# Patient Record
Sex: Female | Born: 1975 | Race: White | Hispanic: Yes | Marital: Married | Smoking: Never smoker
Health system: Southern US, Community
[De-identification: ages and names within clinical notes are randomized; demographics above are authoritative.]

## PROBLEM LIST (undated history)

## (undated) DIAGNOSIS — T8859XA Other complications of anesthesia, initial encounter: Secondary | ICD-10-CM

## (undated) DIAGNOSIS — T4145XA Adverse effect of unspecified anesthetic, initial encounter: Secondary | ICD-10-CM

## (undated) DIAGNOSIS — Z9889 Other specified postprocedural states: Secondary | ICD-10-CM

## (undated) DIAGNOSIS — Z1509 Genetic susceptibility to other malignant neoplasm: Principal | ICD-10-CM

## (undated) DIAGNOSIS — E7800 Pure hypercholesterolemia, unspecified: Secondary | ICD-10-CM

## (undated) DIAGNOSIS — R112 Nausea with vomiting, unspecified: Secondary | ICD-10-CM

## (undated) DIAGNOSIS — Z1501 Genetic susceptibility to malignant neoplasm of breast: Secondary | ICD-10-CM

## (undated) HISTORY — DX: Genetic susceptibility to malignant neoplasm of breast: Z15.01

## (undated) HISTORY — PX: MASTECTOMY: SHX3

## (undated) HISTORY — DX: Genetic susceptibility to malignant neoplasm of breast: Z15.09

## (undated) HISTORY — DX: Pure hypercholesterolemia, unspecified: E78.00

## (undated) HISTORY — PX: WISDOM TOOTH EXTRACTION: SHX21

---

## 2009-03-23 ENCOUNTER — Emergency Department (HOSPITAL_COMMUNITY): Admission: EM | Admit: 2009-03-23 | Discharge: 2009-03-23 | Payer: Self-pay | Admitting: Emergency Medicine

## 2009-05-11 ENCOUNTER — Ambulatory Visit: Payer: Self-pay

## 2010-01-04 ENCOUNTER — Encounter: Admission: RE | Admit: 2010-01-04 | Discharge: 2010-01-04 | Payer: Self-pay | Admitting: Obstetrics and Gynecology

## 2010-01-18 ENCOUNTER — Ambulatory Visit: Payer: Self-pay | Admitting: Internal Medicine

## 2010-02-02 ENCOUNTER — Encounter: Admission: RE | Admit: 2010-02-02 | Discharge: 2010-02-02 | Payer: Self-pay | Admitting: Obstetrics and Gynecology

## 2010-07-01 LAB — POCT I-STAT, CHEM 8
Calcium, Ion: 1.14 mmol/L (ref 1.12–1.32)
Chloride: 103 mEq/L (ref 96–112)
Creatinine, Ser: 0.9 mg/dL (ref 0.4–1.2)
Glucose, Bld: 110 mg/dL — ABNORMAL HIGH (ref 70–99)
HCT: 37 % (ref 36.0–46.0)
Hemoglobin: 12.6 g/dL (ref 12.0–15.0)
Potassium: 3.5 mEq/L (ref 3.5–5.1)

## 2010-07-24 ENCOUNTER — Other Ambulatory Visit: Payer: Self-pay | Admitting: Obstetrics and Gynecology

## 2010-07-24 DIAGNOSIS — Z09 Encounter for follow-up examination after completed treatment for conditions other than malignant neoplasm: Secondary | ICD-10-CM

## 2010-08-09 ENCOUNTER — Other Ambulatory Visit: Payer: Self-pay | Admitting: Obstetrics and Gynecology

## 2010-08-09 ENCOUNTER — Ambulatory Visit
Admission: RE | Admit: 2010-08-09 | Discharge: 2010-08-09 | Disposition: A | Discharge status: Home / Self Care | Payer: Commercial Managed Care - PPO | Source: Ambulatory Visit | Attending: Obstetrics and Gynecology | Admitting: Obstetrics and Gynecology

## 2010-08-09 DIAGNOSIS — Z09 Encounter for follow-up examination after completed treatment for conditions other than malignant neoplasm: Secondary | ICD-10-CM

## 2011-01-17 ENCOUNTER — Other Ambulatory Visit: Payer: Self-pay | Admitting: Obstetrics and Gynecology

## 2011-01-17 DIAGNOSIS — Z1501 Genetic susceptibility to malignant neoplasm of breast: Secondary | ICD-10-CM

## 2011-01-17 DIAGNOSIS — Z803 Family history of malignant neoplasm of breast: Secondary | ICD-10-CM

## 2011-07-18 ENCOUNTER — Other Ambulatory Visit: Payer: Self-pay | Admitting: Obstetrics and Gynecology

## 2011-07-18 DIAGNOSIS — Z1231 Encounter for screening mammogram for malignant neoplasm of breast: Secondary | ICD-10-CM

## 2011-08-15 ENCOUNTER — Ambulatory Visit
Admission: RE | Admit: 2011-08-15 | Discharge: 2011-08-15 | Disposition: A | Discharge status: Home / Self Care | Payer: BC Managed Care – PPO | Source: Ambulatory Visit | Attending: Obstetrics and Gynecology | Admitting: Obstetrics and Gynecology

## 2011-08-15 DIAGNOSIS — Z1231 Encounter for screening mammogram for malignant neoplasm of breast: Secondary | ICD-10-CM

## 2011-09-03 ENCOUNTER — Ambulatory Visit (HOSPITAL_BASED_OUTPATIENT_CLINIC_OR_DEPARTMENT_OTHER): Payer: BC Managed Care – PPO | Admitting: Family

## 2011-09-03 VITALS — BP 120/84 | HR 79 | Temp 98.7°F | Wt 174.3 lb

## 2011-09-03 DIAGNOSIS — Z1509 Genetic susceptibility to other malignant neoplasm: Secondary | ICD-10-CM

## 2011-09-03 DIAGNOSIS — Z1501 Genetic susceptibility to malignant neoplasm of breast: Secondary | ICD-10-CM

## 2011-09-03 DIAGNOSIS — N644 Mastodynia: Secondary | ICD-10-CM

## 2011-09-04 ENCOUNTER — Encounter: Payer: Self-pay | Admitting: Family

## 2011-09-04 DIAGNOSIS — Z1501 Genetic susceptibility to malignant neoplasm of breast: Secondary | ICD-10-CM | POA: Insufficient documentation

## 2011-09-04 NOTE — Progress Notes (Signed)
[facility]  High Risk Clinic Follow-up Visit  Name: Anne Fuller            Date: 09/04/2011 MRN: [id number]                DOB: May 06, 1975  CC: @PCP   @REFPROV   REFERRING PHYSICIAN: Self-referred.   REASON FOR VISIT:Had called the office and spoke with Maylon Cos with questions regarding management of BRCA 1 + status. Was tested in [place] 2 years ago, never received counseling regarding ramifications of positive results. She has questions regarding surgical recommendations vs surveillance.   HISTORY OF PRESENT ILLNESS: Feels well, is emotional in the office because her mother is dying of metastatic breast cancer with brain mets. Mother lives in [place] and the pt just returned from a visit and found her "worse than I thought" physically. She also has a maternal cousin with breast cancer at the present.   Last breast MRI was Nov 2011, just after discovering BRCA status. No abnormality found. No headache or blurred vision. No cough or shortness of breath. No abdominal pain or new bone pain. Bowel and bladder function are normal. Appetite is good, with adequate fluid intake. Remainder of the 10 point  review of systems is negative.  ALLERGIES: Review of patient's allergies indicates no known allergies.  SOCIAL HISTORY:  reports that she has never smoked. She has never used smokeless tobacco. She reports that she does not use illicit drugs. Married with 2 children.   Marland KitchenHEALTH MAINTENANCE: Last mammogram: Last clinical breast exam: Performs self breast exam: Last Pap Smear: Colonoscopy:  Last skin exam:  REVIEW OF SYSTEMS:  General: Negative for fever, chills, night sweats,  loss of appetite or weight loss. HEENT: Negative for headaches, sore  throat, difficulty swallowing, blurred vision or problem with hearing or  sinus congestion. Respiratory: Negative for shortness of breath, cough  or dyspnea on exertion. Cardiovascular: Negative for chest pain,    palpitations or pedal edema. GI: Negative for nausea, vomiting,  diarrhea, constipation, change in bowel habits or blood in the stool.  No jaundice. GU: Negative for painful or frequent urination, change in  color of urine, or decreased urinary stream. Integumentary: Negative  for skin rashes or other suspicious skin lesions. Hematologic: Negative  for easy bruisability or bleeding. Musculoskeletal: Negative for  complaints of pain, arthralgias, arthritis or myalgias.  Neurological/psychiatric: Negative for numbness, focal weakness,  balance problems or coordination difficulties. No depression or mood swings.  Breast: No self detected abnormalities in the breast. No nipple discharge, masses or redness of the skin.   PHYSICAL EXAM: BP 120/84  Pulse 79  Temp(Src) 98.7 F (37.1 C) (Oral)  Wt 174 lb 4.8 oz (79.062 kg) GENERAL: Well developed, well nourished, in no acute distress.  EENT: No ocular or oral lesions. No stomatitis.  RESPIRATORY: Lungs are clear to auscultation bilaterally with normal respiratory movement and no accessory muscle use. CARDIAC: No murmur, rub or tachycardia. No upper or lower extremity edema.  GI: Abdomen is soft, no palpable hepatosplenomegaly. No fluid wave. No tenderness. Musculoskeletal: No kyphosis, no tenderness over the spine, ribs or hips. Lymph: No cervical, infraclavicular, axillary or inguinal adenopathy. Neuro: No focal neurological deficits. Psych: Alert and oriented X 3, appropriate mood and affect.  BREAST EXAM: In the supine position, with the right arm over the head, right nipple is everted. No periareolar edema or nipple discharge. No mass in any quadrant or subareolar region. No redness of the skin. No right  axillary adenopathy. With the left arm over the head, left nipple is everted. No periareolar edema or nipple discharge. No mass in any quadrant or subareolar region. In the area of her interest and reported pain, no palpable abnormality. No  redness of the skin. No left axillary adenopathy.   ASSESSMENT: 36 y/o female with: 1. 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  2. Last breast MRI 01/2010.  3. Pain, left breast.   PLAN:  1. Bilateral breast MRI 2. Left breast diagnostic mammogram to include ultrasound.  3. Transvaginal pelvic ultrasound.  4. Return in 6 months to see Dr. Welton Flakes. No lab needed.    The above plan is consistent with NCCN guidelines for surveillance in a BRCA carrier with retained breasts and ovaries. This is her wish until the age of 58 when she will seriously consider bilateral prophylactic oophorectomy and bilateral prophylactic mastectomy. A total of 45 minutes was spent with the patient, 35 minutes were spent on coordination of care and counseling regarding the above issues.

## 2011-09-05 ENCOUNTER — Other Ambulatory Visit: Payer: Self-pay | Admitting: Family

## 2011-09-05 DIAGNOSIS — Z1501 Genetic susceptibility to malignant neoplasm of breast: Secondary | ICD-10-CM

## 2011-09-05 NOTE — Progress Notes (Signed)
Addended by: Theotis Barrio on: 09/05/2011 12:36 PM   Modules accepted: Orders

## 2011-09-08 ENCOUNTER — Ambulatory Visit (HOSPITAL_COMMUNITY): Payer: BC Managed Care – PPO

## 2011-09-11 ENCOUNTER — Other Ambulatory Visit: Payer: Self-pay | Admitting: Family

## 2011-09-11 DIAGNOSIS — N644 Mastodynia: Secondary | ICD-10-CM

## 2011-09-12 ENCOUNTER — Ambulatory Visit (HOSPITAL_COMMUNITY)
Admission: RE | Admit: 2011-09-12 | Discharge: 2011-09-12 | Disposition: A | Discharge status: Home / Self Care | Payer: BC Managed Care – PPO | Source: Ambulatory Visit | Attending: Family | Admitting: Family

## 2011-09-12 DIAGNOSIS — N838 Other noninflammatory disorders of ovary, fallopian tube and broad ligament: Secondary | ICD-10-CM | POA: Insufficient documentation

## 2011-09-12 DIAGNOSIS — Z1501 Genetic susceptibility to malignant neoplasm of breast: Secondary | ICD-10-CM | POA: Insufficient documentation

## 2011-09-12 DIAGNOSIS — N8000 Endometriosis of the uterus, unspecified: Secondary | ICD-10-CM | POA: Insufficient documentation

## 2011-09-17 ENCOUNTER — Telehealth: Payer: Self-pay | Admitting: *Deleted

## 2011-09-17 NOTE — Telephone Encounter (Signed)
Per NR, notified pt Ovaries look normal on ultrasound.

## 2011-09-17 NOTE — Telephone Encounter (Signed)
Message copied by Cooper Render on Wed Sep 17, 2011  2:27 PM ------      Message from: Anne Fuller      Created: Wed Sep 17, 2011  1:16 PM       Please call and tell her ovaries look normal on ultrasound.

## 2011-09-19 ENCOUNTER — Other Ambulatory Visit: Payer: BC Managed Care – PPO

## 2011-09-23 ENCOUNTER — Telehealth: Payer: Self-pay | Admitting: *Deleted

## 2011-09-23 NOTE — Telephone Encounter (Signed)
Call from [facility], pt no show breast MRI. Pt has 61month window if calls to reschedule.

## 2011-09-24 NOTE — Telephone Encounter (Signed)
Call from [place] at [facility], pt had family emergency and was unable to make appt. Breast MRI has been rescheduled for 6/29.

## 2011-09-25 ENCOUNTER — Ambulatory Visit
Admission: RE | Admit: 2011-09-25 | Discharge: 2011-09-25 | Disposition: A | Discharge status: Home / Self Care | Payer: BC Managed Care – PPO | Source: Ambulatory Visit | Attending: Family | Admitting: Family

## 2011-09-25 DIAGNOSIS — N644 Mastodynia: Secondary | ICD-10-CM

## 2011-09-27 ENCOUNTER — Ambulatory Visit
Admission: RE | Admit: 2011-09-27 | Discharge: 2011-09-27 | Disposition: A | Discharge status: Home / Self Care | Payer: BC Managed Care – PPO | Source: Ambulatory Visit | Attending: Family | Admitting: Family

## 2011-09-27 DIAGNOSIS — Z1501 Genetic susceptibility to malignant neoplasm of breast: Secondary | ICD-10-CM

## 2011-09-27 MED ORDER — GADOBENATE DIMEGLUMINE 529 MG/ML IV SOLN
15.0000 mL | Freq: Once | INTRAVENOUS | Status: AC | PRN
  Administered 2011-09-27: 15 mL via INTRAVENOUS

## 2011-10-10 ENCOUNTER — Telehealth: Payer: Self-pay | Admitting: *Deleted

## 2011-10-10 NOTE — Telephone Encounter (Signed)
Per MD, attempted to notify pt with results of recent MRI. Unable to reach pt. LMOVM for pt to call back regarding recent results. Message for pt upon call back. MRI- no evidence of cancer- per Dr. Welton Flakes

## 2011-10-10 NOTE — Telephone Encounter (Signed)
Message copied by Anne-Marie Genson, Gerald Leitz on Fri Oct 10, 2011 11:12 AM ------      Message from: Anne Fuller      Created: Thu Oct 09, 2011  4:28 PM       Call patient: MRI no evidence of cancer

## 2011-10-10 NOTE — Telephone Encounter (Signed)
Per MD, notified pt MRI shows no evidence of cancer.  Pt advised she is considering prophylactic Dbl Mastectomy with reconstruction next year and has question " Will I still need an MRI and Mammogram every year?" Pt advised she cannot make appt on 03/11/12 as it's best for her to be seen on fridays. Onc tx sched to r/s pt appt. Will review with MD

## 2012-03-05 ENCOUNTER — Other Ambulatory Visit: Payer: Self-pay | Admitting: Family Medicine

## 2012-03-05 DIAGNOSIS — I862 Pelvic varices: Secondary | ICD-10-CM

## 2012-03-11 ENCOUNTER — Ambulatory Visit: Payer: BC Managed Care – PPO | Admitting: Oncology

## 2012-03-17 ENCOUNTER — Ambulatory Visit
Admission: RE | Admit: 2012-03-17 | Discharge: 2012-03-17 | Disposition: A | Discharge status: Home / Self Care | Payer: BC Managed Care – PPO | Source: Ambulatory Visit | Attending: Family Medicine | Admitting: Family Medicine

## 2012-03-17 DIAGNOSIS — I862 Pelvic varices: Secondary | ICD-10-CM

## 2012-09-16 ENCOUNTER — Other Ambulatory Visit: Payer: Self-pay

## 2012-09-16 DIAGNOSIS — Z1231 Encounter for screening mammogram for malignant neoplasm of breast: Secondary | ICD-10-CM

## 2012-09-24 ENCOUNTER — Ambulatory Visit
Admission: RE | Admit: 2012-09-24 | Discharge: 2012-09-24 | Disposition: A | Discharge status: Home / Self Care | Payer: BC Managed Care – PPO | Source: Ambulatory Visit

## 2012-09-24 DIAGNOSIS — Z1231 Encounter for screening mammogram for malignant neoplasm of breast: Secondary | ICD-10-CM

## 2013-03-04 ENCOUNTER — Emergency Department (HOSPITAL_COMMUNITY): Payer: 59

## 2013-03-04 ENCOUNTER — Ambulatory Visit (INDEPENDENT_AMBULATORY_CARE_PROVIDER_SITE_OTHER): Payer: 59 | Admitting: Emergency Medicine

## 2013-03-04 ENCOUNTER — Emergency Department (HOSPITAL_COMMUNITY)
Admission: EM | Admit: 2013-03-04 | Discharge: 2013-03-04 | Disposition: A | Discharge status: Home / Self Care | Payer: 59 | Attending: Emergency Medicine | Admitting: Emergency Medicine

## 2013-03-04 ENCOUNTER — Encounter (HOSPITAL_COMMUNITY): Payer: Self-pay | Admitting: Emergency Medicine

## 2013-03-04 VITALS — BP 130/80 | HR 88 | Temp 98.5°F | Resp 17 | Ht 67.0 in | Wt 185.0 lb

## 2013-03-04 DIAGNOSIS — Z79899 Other long term (current) drug therapy: Secondary | ICD-10-CM | POA: Insufficient documentation

## 2013-03-04 DIAGNOSIS — R51 Headache: Secondary | ICD-10-CM

## 2013-03-04 DIAGNOSIS — Z3202 Encounter for pregnancy test, result negative: Secondary | ICD-10-CM | POA: Insufficient documentation

## 2013-03-04 DIAGNOSIS — R110 Nausea: Secondary | ICD-10-CM | POA: Insufficient documentation

## 2013-03-04 DIAGNOSIS — R519 Headache, unspecified: Secondary | ICD-10-CM

## 2013-03-04 DIAGNOSIS — Z1501 Genetic susceptibility to malignant neoplasm of breast: Secondary | ICD-10-CM | POA: Insufficient documentation

## 2013-03-04 DIAGNOSIS — R42 Dizziness and giddiness: Secondary | ICD-10-CM | POA: Insufficient documentation

## 2013-03-04 LAB — POCT I-STAT, CHEM 8
BUN: 9 mg/dL (ref 6–23)
Chloride: 105 mEq/L (ref 96–112)
Creatinine, Ser: 1 mg/dL (ref 0.50–1.10)
Glucose, Bld: 94 mg/dL (ref 70–99)
HCT: 42 % (ref 36.0–46.0)
Potassium: 3.3 mEq/L — ABNORMAL LOW (ref 3.5–5.1)
TCO2: 23 mmol/L (ref 0–100)

## 2013-03-04 MED ORDER — GADOBENATE DIMEGLUMINE 529 MG/ML IV SOLN
17.0000 mL | Freq: Once | INTRAVENOUS | Status: AC | PRN
  Administered 2013-03-04: 17 mL via INTRAVENOUS

## 2013-03-04 MED ORDER — SODIUM CHLORIDE 0.9 % IV BOLUS (SEPSIS)
500.0000 mL | INTRAVENOUS | Status: AC
  Administered 2013-03-04: 500 mL via INTRAVENOUS

## 2013-03-04 MED ORDER — TRAMADOL HCL 50 MG PO TABS: 50.0000 mg | ORAL_TABLET | Freq: Four times a day (QID) | ORAL | Status: DC | PRN

## 2013-03-04 MED ORDER — IBUPROFEN 800 MG PO TABS: 800.0000 mg | ORAL_TABLET | Freq: Three times a day (TID) | ORAL | Status: DC

## 2013-03-04 NOTE — ED Notes (Signed)
Called MRI, should be about another 45 mins until she can come.

## 2013-03-04 NOTE — ED Provider Notes (Signed)
Medical screening examination/treatment/procedure(s) were conducted as a shared visit with non-physician practitioner(s) and myself.  I personally evaluated the patient during the encounter.  EKG Interpretation   None      Results for orders placed during the hospital encounter of 03/04/13  POCT PREGNANCY, URINE      Result Value Range   Preg Test, Ur NEGATIVE  NEGATIVE   Ct Head Wo Contrast  03/04/2013   CLINICAL DATA:  Headache.  EXAM: CT HEAD WITHOUT CONTRAST  TECHNIQUE: Contiguous axial images were obtained from the base of the skull through the vertex without intravenous contrast.  COMPARISON:  None.  FINDINGS: No mass. No hydrocephalus. No hemorrhage. No acute bony abnormality. Visualized paranasal sinuses are clear. Mastoids are clear. Orbits are unremarkable.  IMPRESSION: Negative exam.   Electronically Signed   By: Maisie Fus  Register   On: 03/04/2013 13:53    Patient CT of the head is negative. Patient was sent over from [facility] urgent care. Patient went there for a headache that started on Tuesday afternoon. Reports the pain becomes worse when she sits and stands while lying supine the headache is gone. While at urgent care she tried to get up and walk and had immediate onset of headache they sent her over here via EMS for further evaluation. Patient's vital signs are normal there's no fevers. Physical examination here without any significant findings. Patient does plan was referred to neurology. Patient doesn't want to go to neurology we'll discuss with patient again may consider doing MRI here today.    Shelda Jakes, MD 03/04/13 [phone]

## 2013-03-04 NOTE — ED Notes (Signed)
Pt reports on Tuesday afternoon, about 4 days ago, she had a sudden onset of HA that increased with standing/sitting. The HA hadn't gone away so she went to urgent care today for evaluation, while there she was fine while laying down but attempted to get up and the HA came back, physician wanted her sent over her for further evaluation. No slurred speech/facial droop. Equal hand grips. PERRLA. Nad, skin warm and dry, resp e/u.

## 2013-03-04 NOTE — ED Provider Notes (Signed)
CSN: [id number]     Arrival date & time 03/04/13  1143 History   First MD Initiated Contact with Patient 03/04/13 1148     Chief Complaint  Patient presents with  . Headache   (Consider location/radiation/quality/duration/timing/severity/associated sxs/prior Treatment) HPI Pt is a 37yo female sent over by urgent care via EMS for further evaluation of headache. Pt reports HA started Tuesday, 03/01/13. States she thought she was coming down with sinusitis and started self medicating with sudafed and Advil. On Wednesday, 12/3, HA became position in nature, increasing significantly when standing and sitting and would resolve when pt laid flat.  Pain has increasingly worsened, so pt went to urgent care today.  Pain is aching and throbbing, starts in front of head and radiating back into back of her head, 8/10 at worse, 0/10 when lying flat for several minutes.  Associated with nausea when HA is at its worst. Reports his of pinched nerve in lower cervical spine but denies numbness or tingling in arms or legs. Denies hx head or neck surgery. No prior hx of similar symptoms. Denies hx of HTN.  Reports feeling well up until symptom onset Tuesday. Denies fever, vomiting or diarrhea. Denies sick contacts or recent travel.  Past Medical History  Diagnosis Date  . BRCA1 genetic carrier    History reviewed. No pertinent past surgical history. Family History  Problem Relation Age of Onset  . Cancer Mother   . Cancer Cousin    History  Substance Use Topics  . Smoking status: Never Smoker   . Smokeless tobacco: Never Used  . Alcohol Use: Not on file   OB History   Grav Para Term Preterm Abortions TAB SAB Ect Mult Living                 Review of Systems  Constitutional: Negative for fever, chills, diaphoresis, appetite change and fatigue.  HENT: Negative for congestion and sore throat.   Respiratory: Negative for cough and shortness of breath.   Cardiovascular: Negative for chest pain.   Gastrointestinal: Positive for nausea. Negative for vomiting and diarrhea.  Musculoskeletal: Negative for back pain, neck pain and neck stiffness.  Neurological: Positive for light-headedness and headaches. Negative for dizziness, tremors, seizures, syncope, facial asymmetry, speech difficulty, weakness and numbness.  All other systems reviewed and are negative.    Allergies  Review of patient's allergies indicates no known allergies.  Home Medications   Current Outpatient Rx  Name  Route  Sig  Dispense  Refill  . ibuprofen (ADVIL,MOTRIN) 200 MG tablet   Oral   Take 600 mg by mouth every 6 (six) hours as needed.         Marland Kitchen levocetirizine (XYZAL) 5 MG tablet   Oral   Take 5 mg by mouth every evening.         . Multiple Vitamin (MULTIVITAMIN WITH MINERALS) TABS tablet   Oral   Take 1 tablet by mouth daily.         . Omega-3 Fatty Acids (FISH OIL PO)   Oral   Take 1 capsule by mouth daily.         Marland Kitchen OVER THE COUNTER MEDICATION   Oral   Take 2 tablets by mouth daily as needed (for headache/cold symptoms). "Severe Head Congestion"         . ibuprofen (ADVIL,MOTRIN) 800 MG tablet   Oral   Take 1 tablet (800 mg total) by mouth 3 (three) times daily.   21 tablet  0   . traMADol (ULTRAM) 50 MG tablet   Oral   Take 1 tablet (50 mg total) by mouth every 6 (six) hours as needed.   15 tablet   0    BP 107/66  Pulse 78  Temp(Src) 98.4 F (36.9 C) (Oral)  Resp 16  SpO2 100%  LMP 02/02/2013 Physical Exam  Nursing note and vitals reviewed. Constitutional: She is oriented to person, place, and time. She appears well-developed and well-nourished. No distress.  Pt lying comfortably in exam bed, NAD.    HENT:  Head: Normocephalic and atraumatic.  Eyes: Conjunctivae and EOM are normal. Pupils are equal, round, and reactive to light. Right eye exhibits no discharge. Left eye exhibits no discharge. No scleral icterus.  Neck: Normal range of motion. Neck supple.  No  nuchal rigidity or meningeal signs.   Cardiovascular: Normal rate, regular rhythm and normal heart sounds.   Pulmonary/Chest: Effort normal and breath sounds normal. No respiratory distress. She has no wheezes. She has no rales. She exhibits no tenderness.  Abdominal: Soft. Bowel sounds are normal. She exhibits no distension and no mass. There is no tenderness. There is no rebound and no guarding.  Musculoskeletal: Normal range of motion.  Neurological: She is alert and oriented to person, place, and time. She has normal strength. No cranial nerve deficit or sensory deficit. Coordination normal. GCS eye subscore is 4. GCS verbal subscore is 5. GCS motor subscore is 6.  Skin: Skin is warm and dry. She is not diaphoretic.    ED Course  Procedures (including critical care time) Labs Review Labs Reviewed  POCT I-STAT, CHEM 8 - Abnormal; Notable for the following:    Potassium 3.3 (*)    All other components within normal limits  POCT PREGNANCY, URINE   Imaging Review Ct Head Wo Contrast  03/04/2013   CLINICAL DATA:  Headache.  EXAM: CT HEAD WITHOUT CONTRAST  TECHNIQUE: Contiguous axial images were obtained from the base of the skull through the vertex without intravenous contrast.  COMPARISON:  None.  FINDINGS: No mass. No hydrocephalus. No hemorrhage. No acute bony abnormality. Visualized paranasal sinuses are clear. Mastoids are clear. Orbits are unremarkable.  IMPRESSION: Negative exam.   Electronically Signed   By: Maisie Fus  Register   On: 03/04/2013 13:53   MR Brain W Ilda Basset Contrast Final result by Rad Results In Interface (03/04/13 18:37:22)    Narrative:   CLINICAL DATA: Headache. Photophobia.  EXAM: MRI HEAD WITHOUT AND WITH CONTRAST  TECHNIQUE: Multiplanar, multiecho pulse sequences of the brain and surrounding structures were obtained without and with intravenous contrast.  CONTRAST: 17mL MULTIHANCE GADOBENATE DIMEGLUMINE 529 MG/ML IV SOLN  COMPARISON: 03/04/2013 head CT. No  comparison brain MR.  FINDINGS: No acute infarct.  No intracranial hemorrhage.  No intracranial mass or abnormal enhancement.  No hydrocephalus.  Major intracranial vascular structures are patent.  Cervical medullary junction, pituitary region, pineal region and orbital structures unremarkable.  Minimal mucosal thickening maxillary sinuses.  IMPRESSION: No acute abnormality. Please see above.        EKG Interpretation   None       MDM   1. Headache    Pt sent to ED via EMS from urgent care due to new onset headache that is positional in nature. Pt has been using OTC sudafed to tx potential sinusitis but states HA is progressively worsening.  HA resolves when lying flat for several minutes. Denies hx of head neck or spinal surgery. No hx of lumbar  puncture. Pt appears well, non-toxic. Afebrile. Neuro exam: unremarkable. 5/5 strength in all major muscle groups.  CN II-XII grossly in tact.  CT head: negative. No evidence of hydrocephalus, mass or hemorrhage.  Discussed findings with Dr. Deretha Emory and pt.  Pt may be discharged home with pain medication, f/u with PCP and [facility] neurology as needed.  Pt verbalized understanding and agreement with tx plan.   Upon RN giving discharge papers, pt stated she wanted to speak with me again to go over possible cause of symptoms. Pt asking for more imaging to help dx cause of headaches.  Dr. Deretha Emory spoke with pt and advised outpatient MRI, however will get MR Brain today as pt very concerned about what is causing her headaches.    MR Brain: no acute abnormality, minimal mucosal thickening maxillary sinuses.  Discussed findings with pt.  Advised to f/u with PCP next week as well as call [facility] for f/u if HA not improved. Return precautions provided.  Rx: tramadol and ibuprofen.  Return precautions provided. Pt verbalized understanding and agreement with tx plan.   Junius Finner, PA-C 03/04/13 1920

## 2013-03-04 NOTE — ED Notes (Signed)
Per EMS - pt coming [facility], went there for a HA that started on Tuesday afternoon, reports the pain becomes worse when she sits/stands. While laying supine, the HA is gone. While at urgent care she tried to get up and walk and had immediate onset of HA, they called EMS to transport to ED for further eval & r/t spinal fluid leakage. BP 128/98 BP 84 RR 12.

## 2013-03-04 NOTE — ED Notes (Signed)
Pt brought back from radiology.

## 2013-03-04 NOTE — Progress Notes (Signed)
   Subjective:    Patient ID: Anne Fuller, female    DOB: 02-17-1976, 37 y.o.   MRN: [id number]  This chart was scribed for Collene Gobble, MD by Dorothey Baseman, ED Scribe.   Chief Complaint  Patient presents with  . Migraine   HPI Anne Fuller is a 37 y.o. female who presents to [facility] complaining of a severe, pressure-like, throbbing headache with fast onset 3 days ago that begins behind the left eye and radiates to the back of the head, down the neck, and into the shoulders. She reports associated neck stiffness, nausea, and fatigue. Patient reports that the pain is exacerbated to a 10/10 with sitting up, movement, cough, and driving. She states the pain is completely alleviated with lying down. She reports taking Advil and Sudafed at home without relief. Patient reports that her current symptoms are new for her and denies history of migraines. Patient reports that she has been staying well-hydrated since onset of her symptoms. She denies emesis, numbness, paresthesias, weakness, visual disturbance, cough, sleep disturbance. Patient reports that she does not use birth control, but denies the possibility of being pregnant. Patient has a history of breast cancer.   Past Medical History  Diagnosis Date  . Breast cancer   . BRCA1 genetic carrier    No current outpatient prescriptions on file prior to visit.   No current facility-administered medications on file prior to visit.   No Known Allergies  Review of Systems  Constitutional: Positive for fatigue.  Eyes: Negative for visual disturbance.  Respiratory: Negative for cough.   Gastrointestinal: Positive for nausea. Negative for vomiting.  Musculoskeletal: Positive for neck stiffness.  Neurological: Positive for headaches. Negative for weakness and numbness.  Psychiatric/Behavioral: Negative for sleep disturbance.      Objective:   Physical Exam  Nursing note and vitals reviewed. Constitutional: She is  oriented to person, place, and time. She appears well-developed and well-nourished. No distress.  HENT:  Head: Normocephalic and atraumatic.  Eyes: Conjunctivae and EOM are normal. Pupils are equal, round, and reactive to light.  Neck: Normal range of motion. Neck supple.  No nuchal rigidity.   Pulmonary/Chest: Effort normal. No respiratory distress.  Abdominal: She exhibits no distension.  Musculoskeletal: Normal range of motion.  Negative straight-leg raise bilaterally.   Neurological: She is alert and oriented to person, place, and time. No cranial nerve deficit.  Pain is completely alleviated in the supine position, but increases to a 10/10 with sitting up.  Skin: Skin is warm and dry.  Psychiatric: She has a normal mood and affect. Her behavior is normal.     BP 130/80  Pulse 88  Temp(Src) 98.5 F (36.9 C) (Oral)  Resp 17  Ht 5\' 7"  (1.702 m)  Wt 185 lb (83.915 kg)  BMI 28.97 kg/m2  SpO2 99%  LMP 02/02/2013  Assessment & Plan:  10:54 AM- Discussed that symptoms do not appear to be due to a migraine, but may be related to CSF pressure. Will transfer patient to [facility] for further evaluation. Discussed treatment plan with patient at bedside and patient verbalized agreement. Her physical exam would say this is related to a low CSF pressure but for some reason. She asked like a post-spinal tap headache. Patient needs further evaluation possibly CT or LP. She was not given medications here. She does have concerns in that she carries a genetic marker for breast cancer.

## 2013-03-09 ENCOUNTER — Encounter: Payer: Self-pay | Admitting: Neurology

## 2013-03-09 ENCOUNTER — Ambulatory Visit (INDEPENDENT_AMBULATORY_CARE_PROVIDER_SITE_OTHER): Payer: 59 | Admitting: Neurology

## 2013-03-09 VITALS — BP 128/83 | HR 97 | Ht 66.0 in | Wt 184.0 lb

## 2013-03-09 DIAGNOSIS — R519 Headache, unspecified: Secondary | ICD-10-CM | POA: Insufficient documentation

## 2013-03-09 DIAGNOSIS — R51 Headache: Secondary | ICD-10-CM | POA: Insufficient documentation

## 2013-03-09 MED ORDER — BUTALBITAL-APAP-CAFFEINE 50-325-40 MG PO TABS: 1.0000 | ORAL_TABLET | Freq: Four times a day (QID) | ORAL | Status: DC | PRN

## 2013-03-09 NOTE — Patient Instructions (Signed)
Call office [phone] in 12/15, 12/16 for continued headache, will make arrangement afterwards.

## 2013-03-09 NOTE — Progress Notes (Signed)
[facility]  PATIENT: Anne Fuller DOB: July 12, 1975  HISTORICAL  Anne Fuller is a 37 yo RH female, accompanied by her husband, was referred by her primary care physician and ED for evaluation of headaches.  She denies a previous history of headache, feel backward landed on stairs in 02/22/2013, on her left back and left shoulder, she complained of pain for 2 days, a week later 03/01/2013, she began to experience vertex area headache, her headache traveling down from top of her head to her occipital region, bilateral shoulder, pressure, crushing pain, getting worse if she sits up for few minutes, improved, after lying down,  Over the past one week, instead of getting better, her symptoms has been progressively worse, as well as she sits up, she began to have mild pounding pain, gradually getting worse with prolonged sitting, by one to 2 hours later, her pain was so intense, 10 out of 10, as if her head going to  explode, she also has blurry vision, ringing in ears, during intense headaches, she has to lie down flat, as well as she lie down, the pressure relieved,  She presented to the emergency room, we reviewed films together, she had a CAT scan, that was normal, she has MRI of brain with and without contrast, that was essentially normal as well,  REVIEW OF SYSTEMS: Full 14 system review of systems performed and notable only for headaches, blurry vision, sleepiness, memory loss, ringing ears,  ALLERGIES: Allergies  Allergen Reactions  . Vicodin [Hydrocodone-Acetaminophen]     Up set stomach    HOME MEDICATIONS: Outpatient Prescriptions Prior to Visit  Medication Sig Dispense Refill  . ibuprofen (ADVIL,MOTRIN) 800 MG tablet Take 1 tablet (800 mg total) by mouth 3 (three) times daily.  21 tablet  0  . levocetirizine (XYZAL) 5 MG tablet Take 5 mg by mouth every evening.      . Multiple Vitamin (MULTIVITAMIN WITH MINERALS) TABS tablet Take 1 tablet by mouth daily.       . Omega-3 Fatty Acids (FISH OIL PO) Take 1 capsule by mouth daily.      Marland Kitchen OVER THE COUNTER MEDICATION Take 2 tablets by mouth daily as needed (for headache/cold symptoms). "Severe Head Congestion"      . traMADol (ULTRAM) 50 MG tablet Take 1 tablet (50 mg total) by mouth every 6 (six) hours as needed.  15 tablet  0  . ibuprofen (ADVIL,MOTRIN) 200 MG tablet Take 600 mg by mouth every 6 (six) hours as needed.       No facility-administered medications prior to visit.    PAST MEDICAL HISTORY: Past Medical History  Diagnosis Date  . 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 Dizziness   . HA (headache)     PAST SURGICAL HISTORY: Past Surgical History  Procedure Laterality Date  . Wisdom tooth extraction      FAMILY HISTORY: Family History  Problem Relation Age of Onset  . Cancer Mother   . Cancer Cousin   . Diabetes Father   . High blood pressure Father   . Heart Problems Father   . High Cholesterol Mother     SOCIAL HISTORY:  History   Social History  . Marital Status: Married    Spouse Name: ricardo    Number of Children: 2  . Years of Education: college   Occupational History  . [employer]    Social History Main Topics  . Smoking status: Never Smoker   . Smokeless tobacco: Never  Used  . Alcohol Use: 0.6 oz/week    1 Glasses of wine per week     Comment: one glass during the week  . Drug Use: No  . Sexual Activity: Yes   Other Topics Concern  . 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   Caffeine- two cups daily.           PHYSICAL EXAM   Filed Vitals:   03/09/13 0827  BP: 128/83  Pulse: 97  Height: 5\' 6"  (1.676 m)  Weight: 184 lb (83.462 kg)    Not recorded    Body mass index is 29.71 kg/(m^2).   Generalized: In no acute distress  Neck: Supple, no carotid bruits   Cardiac: Regular rate  rhythm  Pulmonary: Clear to auscultation bilaterally  Musculoskeletal: No deformity  Neurological examination  Mentation: Alert oriented to time, place, history taking, and causual conversation  Cranial nerve II-XII: Pupils were equal round reactive to light extraocular movements were full, Visual field were full on confrontational test. Bilateral fundi were sharp.  Facial sensation and strength were normal. Hearing was intact to finger rubbing bilaterally. Uvula tongue midline.  head turning and shoulder shrug and were normal and symmetric.Tongue protrusion into cheek strength was normal.  Motor: normal tone, bulk and strength.  Sensory: Intact to fine touch, pinprick, preserved vibratory sensation, and proprioception at toes.  Coordination: Normal finger to nose, heel-to-shin bilaterally there was no truncal ataxia  Gait: Rising up from seated position without assistance, normal stance, without trunk ataxia, moderate stride, good arm swing, smooth turning, able to perform tiptoe, and heel walking without difficulty.   Romberg signs: Negative  Deep tendon reflexes: Brachioradialis 2/2, biceps 2/2, triceps 2/2, patellar 2/2, Achilles 2/2, plantar responses were flexor bilaterally.   DIAGNOSTIC DATA (LABS, IMAGING, TESTING) - I reviewed patient records, labs, notes, testing and imaging myself where available.  Lab Results  Component Value Date   HGB 14.3 03/04/2013   HCT 42.0 03/04/2013      Component Value Date/Time   NA 144 03/04/2013 1519   K 3.3* 03/04/2013 1519   CL 105 03/04/2013 1519   GLUCOSE 94 03/04/2013 1519   BUN 9 03/04/2013 1519   CREATININE 1.00 03/04/2013 1519   ASSESSMENT AND PLAN   37 yo RH Female developed positional headache a week after fall down, now with progressive worsening positional headaches.  1. I have discussed with my radiologist colleague, based on her symptoms, most suggestive of a low pressure headaches, potentially due to leakage at  Spine, She  is on a week out after symptom onset, after discussed with patient, we decided to take a conservative measure for one more week, I encouraged her to lie down flat, liberal fluid intake, increased caffeine intake 2 if she remains symptomatic early next week,.  will consider refer her to [name] Dr. Charlestine Massed [phone], she will keep me informed early next week, 3 Fioricet as needed    Times spend in reviewing the film, coordinating her care, document is 60 minutes  Levert Feinstein, M.D. Ph.D.  [facility] [address] [place], [place] [address] [phone]

## 2013-03-11 ENCOUNTER — Telehealth: Payer: Self-pay | Admitting: Neurology

## 2013-03-11 NOTE — Telephone Encounter (Signed)
Anne Fuller called stating that Dr Terrace Arabia put her on bed rest but her back hurts and she wants to know if she can lay face down on her stomach as well. Please call the Anne Fuller.

## 2013-03-11 NOTE — Telephone Encounter (Signed)
I have called her, she still has headache when she upright, by the time she finished breakfast, she has bad headaches again, it is OK to lie flat on her stomach,   It is OK prn NSAIDs for her back pain.

## 2013-03-14 ENCOUNTER — Telehealth: Payer: Self-pay | Admitting: Neurology

## 2013-03-14 DIAGNOSIS — R51 Headache: Secondary | ICD-10-CM

## 2013-03-14 NOTE — Telephone Encounter (Signed)
I have called her, she still has a lot of headaches, I will refer her to [facility] Radiologist [name] Dr. Charlestine Massed [phone], she will keep me informed early next week,  She still has headches after she sits up 20 minutes.  Annabelle Harman, please connect me to [facility] Physician and text me. Thanks.

## 2013-03-15 NOTE — Telephone Encounter (Signed)
Called DR. Bland Span at [facility] and left message waiting for her to call back.

## 2013-03-16 ENCOUNTER — Telehealth: Payer: Self-pay | Admitting: Neurology

## 2013-03-16 NOTE — Telephone Encounter (Signed)
I have talked with [name] radiologist Dr. Marcy Siren, she has suggested MRI of the cervical, she will also contact patient for possible epidural patch.  I have called her, she still has headache when she is up.

## 2013-03-16 NOTE — Telephone Encounter (Signed)
Called patient and she said that she wanted to check on status of referral to [facility] See phone note from 03/15/13

## 2013-03-17 ENCOUNTER — Telehealth: Payer: Self-pay | Admitting: Neurology

## 2013-03-17 NOTE — Telephone Encounter (Signed)
Explained that the MRI had not been authorized by insurance as of yet(ordered-03/16/13). Patient verbalized understanding

## 2013-03-18 ENCOUNTER — Encounter: Payer: Self-pay | Admitting: Neurology

## 2013-03-18 ENCOUNTER — Ambulatory Visit (INDEPENDENT_AMBULATORY_CARE_PROVIDER_SITE_OTHER): Payer: 59 | Admitting: Neurology

## 2013-03-18 VITALS — BP 120/81 | HR 115 | Ht 67.0 in | Wt 184.0 lb

## 2013-03-18 DIAGNOSIS — R51 Headache: Secondary | ICD-10-CM

## 2013-03-18 NOTE — Addendum Note (Signed)
Addended by: Levert Feinstein on: 03/18/2013 10:30 AM   Modules accepted: Orders

## 2013-03-18 NOTE — Progress Notes (Signed)
[facility]  PATIENT: Anne Fuller DOB: 12/04/1975  HISTORICAL  Anne Fuller is a 37 yo RH female, accompanied by her husband, was referred by her primary care physician and ED for evaluation of headaches.  She denies a previous history of headache, feel backward landed on stairs in 02/22/2013, on her left back and left shoulder, she complained of pain for 2 days, a week later 03/01/2013, she began to experience vertex area headache, her headache traveling down from top of her head to her occipital region, bilateral shoulder, pressure, crushing pain, getting worse if she sits up for few minutes, improved, after lying down,  Over the past one week, instead of getting better, her symptoms has been progressively worse, as well as she sits up, she began to have mild pounding pain, gradually getting worse with prolonged sitting, by one to 2 hours later, her pain was so intense, 10 out of 10, as if her head going to  explode, she also has blurry vision, ringing in ears, during intense headaches, she has to lie down flat, as well as she lie down, the pressure relieved,  She presented to the emergency room, we reviewed films together, she had a CAT scan, that was normal, she has MRI of brain with and without contrast, that was essentially normal as well,  UPDATE 03/18/2013: She still complains of positional headaches, also noticed clear fluid dripping out of her right nose, if she has her head down for a while, I have contact [name] Dr. Dyanne Fuller, who has suggested MRI of the cervical spine, which is in the progress   REVIEW OF SYSTEMS: Full 14 system review of systems performed and notable only for neck pain, ringing in ears, light sensitivity, memory loss, headache.  ALLERGIES: Allergies  Allergen Reactions  . Vicodin [Hydrocodone-Acetaminophen]     Up set stomach    HOME MEDICATIONS: Outpatient Prescriptions Prior to Visit  Medication Sig Dispense Refill  .  ibuprofen (ADVIL,MOTRIN) 800 MG tablet Take 1 tablet (800 mg total) by mouth 3 (three) times daily.  21 tablet  0  . levocetirizine (XYZAL) 5 MG tablet Take 5 mg by mouth every evening.      . Multiple Vitamin (MULTIVITAMIN WITH MINERALS) TABS tablet Take 1 tablet by mouth daily.      . Omega-3 Fatty Acids (FISH OIL PO) Take 1 capsule by mouth daily.      Marland Kitchen OVER THE COUNTER MEDICATION Take 2 tablets by mouth daily as needed (for headache/cold symptoms). "Severe Head Congestion"      . traMADol (ULTRAM) 50 MG tablet Take 1 tablet (50 mg total) by mouth every 6 (six) hours as needed.  15 tablet  0  . ibuprofen (ADVIL,MOTRIN) 200 MG tablet Take 600 mg by mouth every 6 (six) hours as needed.       No facility-administered medications prior to visit.    PAST MEDICAL HISTORY: Past Medical History  Diagnosis Date  . 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 Dizziness   . HA (headache)     PAST SURGICAL HISTORY: Past Surgical History  Procedure Laterality Date  . Wisdom tooth extraction      FAMILY HISTORY: Family History  Problem Relation Age of Onset  . Cancer Mother   . Cancer Cousin   . Diabetes Father   . High blood pressure Father   . Heart Problems Father   . High Cholesterol Mother     SOCIAL HISTORY:  History   Social History  .  Marital Status: Married    Spouse Name: Anne Fuller    Number of Children: 2  . Years of Education: college   Occupational History  . [employer]    Social History Main Topics  . Smoking status: Never Smoker   . Smokeless tobacco: Never Used  . Alcohol Use: 0.6 oz/week    1 Glasses of wine per week     Comment: one glass during the week  . Drug Use: No  . Sexual Activity: Yes   Other Topics Concern  . 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   Caffeine- two cups daily.            PHYSICAL EXAM   Filed Vitals:   03/18/13 0901  BP: 120/81  Pulse: 115  Height: 5\' 7"  (1.702 m)  Weight: 184 lb (83.462 kg)    Not recorded    Body mass index is 28.81 kg/(m^2).   Generalized: In no acute distress  Neck: Supple, no carotid bruits   Cardiac: Regular rate rhythm  Pulmonary: Clear to auscultation bilaterally  Musculoskeletal: No deformity  Neurological examination  Mentation: Alert oriented to time, place, history taking, and causual conversation  Cranial nerve II-XII: Pupils were equal round reactive to light extraocular movements were full, Visual field were full on confrontational test. Bilateral fundi were sharp.  Facial sensation and strength were normal. Hearing was intact to finger rubbing bilaterally. Uvula tongue midline.  head turning and shoulder shrug and were normal and symmetric.Tongue protrusion into cheek strength was normal.  Motor: normal tone, bulk and strength.  Sensory: Intact to fine touch, pinprick, preserved vibratory sensation, and proprioception at toes.  Coordination: Normal finger to nose, heel-to-shin bilaterally there was no truncal ataxia  Gait: Rising up from seated position without assistance, normal stance, without trunk ataxia, moderate stride, good arm swing, smooth turning, able to perform tiptoe, and heel walking without difficulty.   Romberg signs: Negative  Deep tendon reflexes: Brachioradialis 2/2, biceps 2/2, triceps 2/2, patellar 2/2, Achilles 2/2, plantar responses were flexor bilaterally.   DIAGNOSTIC DATA (LABS, IMAGING, TESTING) - I reviewed patient records, labs, notes, testing and imaging myself where available.  Lab Results  Component Value Date   HGB 14.3 03/04/2013   HCT 42.0 03/04/2013      Component Value Date/Time   NA 144 03/04/2013 1519   K 3.3* 03/04/2013 1519   CL 105 03/04/2013 1519   GLUCOSE 94 03/04/2013 1519   BUN 9 03/04/2013 1519   CREATININE 1.00 03/04/2013 1519   ASSESSMENT AND  PLAN   37 yo RH Female developed positional headache a week after fall down, now with progressive worsening positional headaches. Clear fluid leakage through her right nose.  1. her symptoms are most suggestive of a low pressure headaches, potentially due to leakage at  Spine, vs Cranial skull CSF leakage. 2.  I have discussed with [name] radiologist Dr. Dyanne Fuller, suggested MRI cervical. 3. I also discussed with ENT Dr. Lazarus Salines,  has sugged CT facial/sinus.    Times spend in reviewing the film, coordinating her care, document is 60 minutes  Levert Feinstein, M.D. Ph.D.  [facility] [address], [address] [place], [place] [address] [phone]

## 2013-03-21 ENCOUNTER — Telehealth: Payer: Self-pay | Admitting: Neurology

## 2013-03-21 NOTE — Telephone Encounter (Signed)
I spoke to patient and she already has copies of the cd's for her MRI cervical and CT.  Additional copies are on the doctors desk.  She is waiting to hear from [facility], and will call next week if she hasn't already heard.  I have also mailed copies of the reports to the patient.

## 2013-04-04 ENCOUNTER — Telehealth: Payer: Self-pay | Admitting: Neurology

## 2013-04-04 NOTE — Telephone Encounter (Signed)
States she is returning Dr. Krista Blue

## 2013-04-04 NOTE — Telephone Encounter (Signed)
I have called her, she still has positional headache, she has appointment with Dr. Grace Blight in Jan 20th 2015.   She would developed positional headache after standing up for 30 minutes.

## 2013-04-04 NOTE — Telephone Encounter (Signed)
Spoke with patient and she said that she had missed call but wanted to inform Dr Krista Blue that she does not need another letter just yet, wanted to get her opinion on Dr Angelique Holm associate(Dr Gilman Buttner, who she has been schedule).  She has an appointment for Dr Krista Blue for Friday, should she reschedule that?

## 2013-04-05 NOTE — Telephone Encounter (Signed)
Anne Fuller:  Reschedule her appt after [name] visit. I do not know Dr. Josem Kaufmann

## 2013-04-06 ENCOUNTER — Other Ambulatory Visit: Payer: Self-pay

## 2013-04-06 ENCOUNTER — Telehealth: Payer: Self-pay | Admitting: Neurology

## 2013-04-06 NOTE — Telephone Encounter (Signed)
Cancel her appt until she had visit to [facility].

## 2013-04-06 NOTE — Telephone Encounter (Signed)
Called PT to remind them of their appt on Friday.  Pt states she has an appt on 1/20 at [facility] where Dr. Krista Blue referred her to.  She wasn't sure whether to keep this appt or not.  Please call.

## 2013-04-06 NOTE — Telephone Encounter (Signed)
Patient wants to know if she should keep appt. With you this Friday or CX ? Patient states she has appt. With [name] on 04-19-2013 with Dr.Ambyein. Dr. Fransico Michael  Does not have any opening for 6 months.

## 2013-04-07 NOTE — Telephone Encounter (Signed)
Done

## 2013-04-08 ENCOUNTER — Ambulatory Visit: Payer: 59 | Admitting: Neurology

## 2013-05-09 ENCOUNTER — Telehealth: Payer: Self-pay | Admitting: *Deleted

## 2013-05-10 NOTE — Telephone Encounter (Signed)
Patient wanted to inform Dr Krista Blue that she had her blood patch 3 weeks ago at [facility], everything is ok, has gone back to work, is communicating with Dr Amrhein/[facility]'s office and does not feel a f/u is needed at this point.

## 2013-05-11 ENCOUNTER — Telehealth: Payer: Self-pay | Admitting: Neurology

## 2013-05-20 NOTE — Telephone Encounter (Signed)
error 

## 2013-08-01 ENCOUNTER — Other Ambulatory Visit: Payer: Self-pay

## 2013-08-01 DIAGNOSIS — Z1231 Encounter for screening mammogram for malignant neoplasm of breast: Secondary | ICD-10-CM

## 2013-09-23 ENCOUNTER — Ambulatory Visit
Admission: RE | Admit: 2013-09-23 | Discharge: 2013-09-23 | Disposition: A | Discharge status: Home / Self Care | Payer: 59 | Source: Ambulatory Visit

## 2013-09-23 DIAGNOSIS — Z1231 Encounter for screening mammogram for malignant neoplasm of breast: Secondary | ICD-10-CM

## 2013-11-08 ENCOUNTER — Other Ambulatory Visit: Payer: Self-pay | Admitting: Obstetrics and Gynecology

## 2013-11-08 DIAGNOSIS — Z803 Family history of malignant neoplasm of breast: Secondary | ICD-10-CM

## 2013-11-08 DIAGNOSIS — R920 Mammographic microcalcification found on diagnostic imaging of breast: Secondary | ICD-10-CM

## 2013-11-16 ENCOUNTER — Other Ambulatory Visit: Payer: 59

## 2013-11-17 ENCOUNTER — Other Ambulatory Visit: Payer: 59

## 2013-11-22 ENCOUNTER — Ambulatory Visit
Admission: RE | Admit: 2013-11-22 | Discharge: 2013-11-22 | Disposition: A | Discharge status: Home / Self Care | Payer: 59 | Source: Ambulatory Visit | Attending: Obstetrics and Gynecology | Admitting: Obstetrics and Gynecology

## 2013-11-22 DIAGNOSIS — R920 Mammographic microcalcification found on diagnostic imaging of breast: Secondary | ICD-10-CM

## 2013-11-22 DIAGNOSIS — Z803 Family history of malignant neoplasm of breast: Secondary | ICD-10-CM

## 2013-11-22 MED ORDER — GADOBENATE DIMEGLUMINE 529 MG/ML IV SOLN
15.0000 mL | Freq: Once | INTRAVENOUS | Status: AC | PRN
  Administered 2013-11-22: 15 mL via INTRAVENOUS

## 2014-11-20 ENCOUNTER — Ambulatory Visit (INDEPENDENT_AMBULATORY_CARE_PROVIDER_SITE_OTHER): Payer: BC Managed Care – PPO

## 2014-11-20 ENCOUNTER — Ambulatory Visit (INDEPENDENT_AMBULATORY_CARE_PROVIDER_SITE_OTHER): Payer: BC Managed Care – PPO | Admitting: Family Medicine

## 2014-11-20 VITALS — BP 118/72 | HR 82 | Temp 98.6°F | Resp 16 | Ht 67.0 in | Wt 177.0 lb

## 2014-11-20 DIAGNOSIS — M79601 Pain in right arm: Secondary | ICD-10-CM

## 2014-11-20 DIAGNOSIS — S9031XA Contusion of right foot, initial encounter: Secondary | ICD-10-CM | POA: Diagnosis not present

## 2014-11-20 NOTE — Patient Instructions (Signed)
Take Aleve 2 tablets twice daily for pain and inflammation  Apply ice several times daily for a few days  Return if symptoms continue to persist.  Wear comfortable shoes

## 2014-11-20 NOTE — Progress Notes (Signed)
  Subjective:  Patient ID: Anne Fuller, female    DOB: [birth year]  Age: 39 y.o. MRN: [id number]  39 year old lady who was out on Saturday. They were doing some exploring and she slipped and tripped. She landed on her right arm a foosh type injury. She had some initial pain, but thought it was okay.  She is continued to pain in the right mid forearm. She has felt a little popping in there. When she was keyboarding a lot today it was hurting her.  She also landed on right heel. She has some medial pain on that. She does not think it is of significant concern, but told us about it since she was in here anyway.   Objective:   Healthy-appearing lady. No gross abnormality visible in her forearm. She has good range of motion of the elbow, wrist, and hand. She felt a little numbness down into her head today, but has grossly normal sensation at this time. She is tender in the radial area of the arm at about the 1/3-1/2 zone of the forearm. No erythema or swelling. The right heel has good range of motion. She is tender on the distal medial aspect of the calcaneus. Is able to walk satisfactorily without a major limp on it. The tenderness is only in a small area, moderate.    Assessment: Pain right forearm secondary to injury Pain right heel, contusion.  Plan: X-ray right forearm Treat heel symptomatically  [facility] reading (PRIMARY) by  Dr. Linna Darner Normal foream.    Patient Instructions  Take Aleve 2 tablets twice daily for pain and inflammation  Apply ice several times daily for a few days  Return if symptoms continue to persist.  Wear comfortable shoes       HOPPER,DAVID, MD 11/20/2014

## 2015-07-19 ENCOUNTER — Other Ambulatory Visit: Payer: Self-pay | Admitting: Obstetrics and Gynecology

## 2015-07-19 DIAGNOSIS — Z803 Family history of malignant neoplasm of breast: Secondary | ICD-10-CM

## 2015-08-04 ENCOUNTER — Other Ambulatory Visit: Payer: Self-pay

## 2015-10-30 ENCOUNTER — Encounter: Payer: Self-pay | Admitting: Plastic Surgery

## 2016-06-19 ENCOUNTER — Other Ambulatory Visit: Payer: Self-pay | Admitting: General Surgery

## 2016-06-19 DIAGNOSIS — Z1231 Encounter for screening mammogram for malignant neoplasm of breast: Secondary | ICD-10-CM

## 2016-06-19 DIAGNOSIS — Z1509 Genetic susceptibility to other malignant neoplasm: Principal | ICD-10-CM

## 2016-06-19 DIAGNOSIS — Z15068 Genetic susceptibility to other malignant neoplasm of digestive system: Secondary | ICD-10-CM

## 2016-06-19 DIAGNOSIS — Z1501 Genetic susceptibility to malignant neoplasm of breast: Secondary | ICD-10-CM

## 2016-07-28 ENCOUNTER — Ambulatory Visit
Admission: RE | Admit: 2016-07-28 | Discharge: 2016-07-28 | Disposition: A | Discharge status: Home / Self Care | Payer: BC Managed Care – PPO | Source: Ambulatory Visit | Attending: General Surgery | Admitting: General Surgery

## 2016-07-28 DIAGNOSIS — Z1231 Encounter for screening mammogram for malignant neoplasm of breast: Secondary | ICD-10-CM

## 2016-08-04 ENCOUNTER — Ambulatory Visit
Admission: RE | Admit: 2016-08-04 | Discharge: 2016-08-04 | Disposition: A | Discharge status: Home / Self Care | Payer: BC Managed Care – PPO | Source: Ambulatory Visit | Attending: General Surgery | Admitting: General Surgery

## 2016-08-04 DIAGNOSIS — Z1509 Genetic susceptibility to other malignant neoplasm: Principal | ICD-10-CM

## 2016-08-04 DIAGNOSIS — Z1501 Genetic susceptibility to malignant neoplasm of breast: Secondary | ICD-10-CM

## 2016-08-04 MED ORDER — GADOBENATE DIMEGLUMINE 529 MG/ML IV SOLN
18.0000 mL | Freq: Once | INTRAVENOUS | Status: AC | PRN
  Administered 2016-08-04: 18 mL via INTRAVENOUS

## 2016-08-05 NOTE — Progress Notes (Signed)
Please let patient know MRI is negative.

## 2016-09-26 NOTE — H&P (Signed)
Subjective:     Patient ID: Anne Fuller is a 41 y.o. female.  HPI  Here for follow up discussion breast reconstruction prior to planned bilateral  Diagnosed with BRCA 1 2011 after mother diagnosed. Mother passed of metastatic breast ca. Patient desires to proceed with  bilateral prophylactic mastectomies in July. She works for [employer] and plans to take 2 months off. Closed on house and in process of moving. Accompanied by husband.  Last MRI 07/2016 benign.  Current C, happy with this. Weight- last visit was up 11 lb over initial consult; today down 2 lbs since last visit.  Mother did attempt reconstruction implant based, had problems with expanders including infection and ultimately removed. Notes she was diagnosed with mets to brain shortly after expanders removed and then passed quickly. Expressed some concern that her mother "rejected" the expanders.  She is on OCP, states this is not for birth control as husband had vasectomy but to suppress ovaries. She has scheduled BSO for 8.29.18     Objective:   Physical Exam  Cardiovascular: Normal rate.   Pulmonary/Chest: Effort normal.  Abdominal: Soft.  +umbilical hernia, redundant tissue without panniculus  no masses Grade 1 ptosis bilat SN to nipple R 24 L 23.5 cm BW R 18 L 17 cm (Chest width 14 cm) Nipple to IMF R 8 L 9 cm    Assessment:     BRCA1    Plan:      Plan placement TE at time of mastectomies. She has elected for NSM.   Reviewed process of expansion and implant based risks including rupture, MRI surveillance for silicone implants, infection requiring surgery or removal, contracture.  Reviewed SSM vs NSM, latter will be asensate and not stimulate. Reviewed with both risks mastectomy flap necrosis requiring additional surgery. Discussed IMF incision with NSM preferred approach. Additional risks including but not limited to bleeding, hematoma, seroma, damage to deeper structures, need for  additional surgery, asymmetry, unacceptable cosmetic result, DVT/PE, cardiopulmonary complications.  Discussed use of acellular dermis in reconstruction, cadaveric source, incorporation over several weeks, risk that if has seroma or infection can act as additional nidus for infection if not incorporated.   Discussed prepectoral vs sub pectoral reconstruction. Discussed with patient and benefit of this is no animation deformity, may be less pain. Risk may be more visible rippling over upper poles, greater need of ADM. Reviewed pre pectoral would require larger amount acellular dermis, more drains. Discussed any type reconstruction also risks long term displacement implant and visible rippling. If prepectoral counseled I would recommend she be comfortable with silicone implants as more options that have less rippling; she states she was planning "gummy bear" implants.  She initially presented considering dual plane/below muscle reconstruction. We watched video of woman demonstrating animation deformity. Following that patient has elected for prepectoral placement.   Leave paperwork completed for 8 weeks.  No Rx given, has nausea with both hydrocodone and oxycodone in past, not sure if it was because she had no food in stomach, wants to try with nausea medicine. Does ok with T#3.  Irene Limbo, MD [facility] [phone], pin [phone]

## 2016-09-29 ENCOUNTER — Encounter (HOSPITAL_BASED_OUTPATIENT_CLINIC_OR_DEPARTMENT_OTHER): Payer: Self-pay | Admitting: *Deleted

## 2016-09-29 NOTE — Pre-Procedure Instructions (Signed)
Boost Breeze 8 oz. given to pt. with instructions to drink by 0400 DOS; CHG 4% 4 oz. given to pt. with instructions to use night before surgery and AM DOS in shower; wash from neck down excluding perineum; wash hair first if she is also washing hair at the time of shower.  Pt. voiced understanding.

## 2016-10-06 NOTE — H&P (Signed)
Shylah Dossantos 09/29/2016 4:30 PM Location: [facility] Patient #: [id number] DOB: 22-May-1975 Married / Language: English / Race: Refused to Report/Unreported Female  History of Present Illness Stark Klein MD; 10/06/2016 6:42 PM) The patient is a 41 year old female who presents for a Follow-up for Breast problems. Pt is a 41 yo F here for follow up of BRCA positivity. She had a sister with breast cancer. She has had additional relatives that have deloped breast and ovarian cancer. Testing was done at the cancer center in [place] (Dr Georga Bora, GYN). She has had breast MRI screening that has been negative.   She has seen plastic surgery and has discussed what type of reconstruction she would like. She has had recent imaging detecting no evidence of breast cancer in either side. She desires to go ahead and have bilateral mastectomy at this time.  Breast MRI 08/04/16  IMPRESSION: 1. There is no abnormal enhancement in either breast to suggest breast malignancy. 2. No change from the prior breast MRI.   Allergies (April Staton, CMA; 09/29/2016 4:30 PM) Latex Exam Gloves *MEDICAL DEVICES AND SUPPLIES* Hydrocodone-Acetaminophen *ANALGESICS - OPIOID* Vomiting. passing out fainting Percocet *ANALGESICS - OPIOID*  Medication History (April Staton, CMA; 09/29/2016 4:30 PM) Nordette (21) (0.15-30MG-MCG Tablet, Oral) Active. Allegra (180MG Tablet, Oral as needed) Active. Vitamin D (Ergocalciferol) (50000UNIT Capsule, Oral) Active. Omega 3 (Oral) Specific strength unknown - Active. Medications Reconciled     Review of Systems Stark Klein MD; 10/06/2016 6:40 PM) All other systems negative  Vitals (April Staton CMA; 09/29/2016 4:31 PM) 09/29/2016 4:30 PM Weight: 189.5 lb Height: 67.5in Body Surface Area: 1.99 m Body Mass Index: 29.24 kg/m  Temp.: 98.71F(Oral)  Pulse: 80 (Regular)  P.OX: 99% (Room air) BP: 112/80 (Sitting, Left Arm,  Standard)      Physical Exam Stark Klein MD; 10/06/2016 6:41 PM)  General Mental Status-Alert. General Appearance-Consistent with stated age. Hydration-Well hydrated. Voice-Normal.  Head and Neck Head-normocephalic, atraumatic with no lesions or palpable masses.  Eye Sclera/Conjunctiva - Bilateral-No scleral icterus.  Chest and Lung Exam Chest and lung exam reveals -quiet, even and easy respiratory effort with no use of accessory muscles. Inspection Chest Wall - Normal. Back - normal.  Breast Note: No significant change. Scattered breast parenchymal tissue. No palpable breast masses. No skin dimpling, nipple discharge, or nipple retraction. No LAD. Breasts are symmetric bilaterally. minimal ptosis.  Cardiovascular Cardiovascular examination reveals -normal pedal pulses bilaterally. Note:regular rate and rhythm  Abdomen Inspection-Inspection Normal. Palpation/Percussion Palpation and Percussion of the abdomen reveal - Soft, Non Tender, No Rebound tenderness, No Rigidity (guarding) and No hepatosplenomegaly.  Peripheral Vascular Upper Extremity Inspection - Bilateral - Normal - No Clubbing, No Cyanosis, No Edema, Pulses Intact. Lower Extremity Palpation - Edema - Bilateral - No edema.  Neurologic Neurologic evaluation reveals -alert and oriented x 3 with no impairment of recent or remote memory. Mental Status-Normal.  Musculoskeletal Global Assessment -Note:no gross deformities.  Normal Exam - Left-Upper Extremity Strength Normal and Lower Extremity Strength Normal. Normal Exam - Right-Upper Extremity Strength Normal and Lower Extremity Strength Normal.  Lymphatic Head & Neck  General Head & Neck Lymphatics: Bilateral - Description - Normal. Axillary  General Axillary Region: Bilateral - Description - Normal. Tenderness - Non Tender.    Assessment & Plan Stark Klein MD; 10/06/2016 6:42 PM)  BRCA POSITIVE  (Z15.01) Impression: She is set up for surgery next week. She is planning on having bilateral oophorectomy at the end of the summer. I reviewed  the surgery as well as the risks. I discussed it with Dr. Iran Planas. She understands and wishes to proceed.  Current Plans Patient was instructed to follow-up in one week for surgery! Pt Education - CCS Mastectomy HCI

## 2016-10-07 ENCOUNTER — Encounter (HOSPITAL_BASED_OUTPATIENT_CLINIC_OR_DEPARTMENT_OTHER)
Admission: RE | Disposition: A | Discharge status: Home / Self Care | Payer: Self-pay | Source: Ambulatory Visit | Attending: Plastic Surgery

## 2016-10-07 ENCOUNTER — Ambulatory Visit (HOSPITAL_BASED_OUTPATIENT_CLINIC_OR_DEPARTMENT_OTHER): Payer: BC Managed Care – PPO | Admitting: Anesthesiology

## 2016-10-07 ENCOUNTER — Ambulatory Visit (HOSPITAL_BASED_OUTPATIENT_CLINIC_OR_DEPARTMENT_OTHER)
Admission: RE | Admit: 2016-10-07 | Discharge: 2016-10-08 | Disposition: A | Discharge status: Home / Self Care | Payer: BC Managed Care – PPO | Source: Ambulatory Visit | Attending: Plastic Surgery | Admitting: Plastic Surgery

## 2016-10-07 ENCOUNTER — Encounter (HOSPITAL_BASED_OUTPATIENT_CLINIC_OR_DEPARTMENT_OTHER): Payer: Self-pay | Admitting: Anesthesiology

## 2016-10-07 DIAGNOSIS — Z803 Family history of malignant neoplasm of breast: Secondary | ICD-10-CM | POA: Insufficient documentation

## 2016-10-07 DIAGNOSIS — Z808 Family history of malignant neoplasm of other organs or systems: Secondary | ICD-10-CM | POA: Diagnosis not present

## 2016-10-07 DIAGNOSIS — Z8041 Family history of malignant neoplasm of ovary: Secondary | ICD-10-CM | POA: Diagnosis not present

## 2016-10-07 DIAGNOSIS — Z1501 Genetic susceptibility to malignant neoplasm of breast: Secondary | ICD-10-CM | POA: Diagnosis present

## 2016-10-07 DIAGNOSIS — D242 Benign neoplasm of left breast: Secondary | ICD-10-CM | POA: Diagnosis not present

## 2016-10-07 DIAGNOSIS — Z885 Allergy status to narcotic agent status: Secondary | ICD-10-CM | POA: Insufficient documentation

## 2016-10-07 DIAGNOSIS — Z91040 Latex allergy status: Secondary | ICD-10-CM | POA: Diagnosis not present

## 2016-10-07 DIAGNOSIS — Z1509 Genetic susceptibility to other malignant neoplasm: Secondary | ICD-10-CM

## 2016-10-07 HISTORY — DX: Other complications of anesthesia, initial encounter: T88.59XA

## 2016-10-07 HISTORY — PX: NIPPLE SPARING MASTECTOMY: SHX6537

## 2016-10-07 HISTORY — DX: Adverse effect of unspecified anesthetic, initial encounter: T41.45XA

## 2016-10-07 SURGERY — MASTECTOMY, NIPPLE SPARING
Anesthesia: General | Site: Breast | Laterality: Bilateral

## 2016-10-07 MED ORDER — SUFENTANIL CITRATE 50 MCG/ML IV SOLN
INTRAVENOUS | Status: AC
  Filled 2016-10-07: qty 1

## 2016-10-07 MED ORDER — CHLORHEXIDINE GLUCONATE CLOTH 2 % EX PADS: 6.0000 | MEDICATED_PAD | Freq: Once | CUTANEOUS | Status: DC

## 2016-10-07 MED ORDER — DEXAMETHASONE SODIUM PHOSPHATE 4 MG/ML IJ SOLN
INTRAMUSCULAR | Status: DC | PRN
  Administered 2016-10-07: 10 mg via INTRAVENOUS
  Administered 2016-10-07: 4 mg via INTRAVENOUS

## 2016-10-07 MED ORDER — SUGAMMADEX SODIUM 200 MG/2ML IV SOLN
INTRAVENOUS | Status: DC | PRN
  Administered 2016-10-07: 200 mg via INTRAVENOUS
  Administered 2016-10-07: 400 mg via INTRAVENOUS

## 2016-10-07 MED ORDER — CELECOXIB 200 MG PO CAPS
ORAL_CAPSULE | ORAL | Status: AC
  Filled 2016-10-07: qty 2

## 2016-10-07 MED ORDER — PROCHLORPERAZINE MALEATE 10 MG PO TABS: 10.0000 mg | ORAL_TABLET | Freq: Four times a day (QID) | ORAL | Status: DC | PRN

## 2016-10-07 MED ORDER — CEFAZOLIN SODIUM-DEXTROSE 1-4 GM/50ML-% IV SOLN
1.0000 g | Freq: Three times a day (TID) | INTRAVENOUS | Status: DC
  Administered 2016-10-07 – 2016-10-08 (×2): 1 g via INTRAVENOUS
  Filled 2016-10-07 (×2): qty 50

## 2016-10-07 MED ORDER — POVIDONE-IODINE 10 % EX SOLN
CUTANEOUS | Status: DC | PRN
  Administered 2016-10-07: 1 via TOPICAL

## 2016-10-07 MED ORDER — CEFAZOLIN SODIUM-DEXTROSE 2-4 GM/100ML-% IV SOLN
2.0000 g | INTRAVENOUS | Status: AC
  Administered 2016-10-07 (×2): 2 g via INTRAVENOUS

## 2016-10-07 MED ORDER — GABAPENTIN 300 MG PO CAPS
ORAL_CAPSULE | ORAL | Status: AC
  Filled 2016-10-07: qty 1

## 2016-10-07 MED ORDER — ACETAMINOPHEN 500 MG PO TABS
ORAL_TABLET | ORAL | Status: AC
  Filled 2016-10-07: qty 2

## 2016-10-07 MED ORDER — 0.9 % SODIUM CHLORIDE (POUR BTL) OPTIME
TOPICAL | Status: DC | PRN
  Administered 2016-10-07: 1000 mL

## 2016-10-07 MED ORDER — ROPIVACAINE HCL 5 MG/ML IJ SOLN
INTRAMUSCULAR | Status: DC | PRN
  Administered 2016-10-07: 50 mL via PERINEURAL

## 2016-10-07 MED ORDER — ONDANSETRON 4 MG PO TBDP
4.0000 mg | ORAL_TABLET | Freq: Four times a day (QID) | ORAL | Status: DC | PRN
Start: 2016-10-07 — End: 2016-10-08
  Administered 2016-10-07: 4 mg via ORAL
  Filled 2016-10-07: qty 1

## 2016-10-07 MED ORDER — PROPOFOL 10 MG/ML IV BOLUS
INTRAVENOUS | Status: DC | PRN
  Administered 2016-10-07: 150 mg via INTRAVENOUS

## 2016-10-07 MED ORDER — SUFENTANIL CITRATE 50 MCG/ML IV SOLN
INTRAVENOUS | Status: DC | PRN
  Administered 2016-10-07 (×2): 10 ug via INTRAVENOUS

## 2016-10-07 MED ORDER — HEPARIN SODIUM (PORCINE) 5000 UNIT/ML IJ SOLN
INTRAMUSCULAR | Status: AC
  Filled 2016-10-07: qty 1

## 2016-10-07 MED ORDER — PHENYLEPHRINE HCL 10 MG/ML IJ SOLN
INTRAMUSCULAR | Status: DC | PRN
  Administered 2016-10-07: 40 ug via INTRAVENOUS

## 2016-10-07 MED ORDER — FENTANYL CITRATE (PF) 100 MCG/2ML IJ SOLN
INTRAMUSCULAR | Status: AC
  Filled 2016-10-07: qty 2

## 2016-10-07 MED ORDER — SCOPOLAMINE 1 MG/3DAYS TD PT72: 1.0000 | MEDICATED_PATCH | Freq: Once | TRANSDERMAL | Status: DC | PRN

## 2016-10-07 MED ORDER — KCL IN DEXTROSE-NACL 20-5-0.45 MEQ/L-%-% IV SOLN
INTRAVENOUS | Status: DC
  Administered 2016-10-07 – 2016-10-08 (×2): via INTRAVENOUS
  Filled 2016-10-07 (×2): qty 1000

## 2016-10-07 MED ORDER — PROCHLORPERAZINE MALEATE 10 MG PO TABS: 10.0000 mg | ORAL_TABLET | Freq: Four times a day (QID) | ORAL | 0 refills | Status: AC | PRN

## 2016-10-07 MED ORDER — LACTATED RINGERS IV SOLN
INTRAVENOUS | Status: DC
  Administered 2016-10-07 (×2): via INTRAVENOUS

## 2016-10-07 MED ORDER — SODIUM CHLORIDE 0.9 % IV SOLN
INTRAVENOUS | Status: DC | PRN
  Administered 2016-10-07: 1000 mL

## 2016-10-07 MED ORDER — HYDROMORPHONE HCL 1 MG/ML IJ SOLN: 0.5000 mg | INTRAMUSCULAR | Status: DC | PRN

## 2016-10-07 MED ORDER — HEPARIN SODIUM (PORCINE) 5000 UNIT/ML IJ SOLN
5000.0000 [IU] | Freq: Once | INTRAMUSCULAR | Status: AC
  Administered 2016-10-07: 5000 [IU] via SUBCUTANEOUS

## 2016-10-07 MED ORDER — KETOROLAC TROMETHAMINE 30 MG/ML IJ SOLN
30.0000 mg | Freq: Three times a day (TID) | INTRAMUSCULAR | Status: AC
  Administered 2016-10-07 – 2016-10-08 (×3): 30 mg via INTRAVENOUS
  Filled 2016-10-07 (×3): qty 1

## 2016-10-07 MED ORDER — MEPERIDINE HCL 25 MG/ML IJ SOLN: 6.2500 mg | INTRAMUSCULAR | Status: DC | PRN

## 2016-10-07 MED ORDER — SULFAMETHOXAZOLE-TRIMETHOPRIM 800-160 MG PO TABS: 1.0000 | ORAL_TABLET | Freq: Two times a day (BID) | ORAL | 0 refills | Status: DC

## 2016-10-07 MED ORDER — CELECOXIB 400 MG PO CAPS
400.0000 mg | ORAL_CAPSULE | ORAL | Status: AC
  Administered 2016-10-07: 400 mg via ORAL

## 2016-10-07 MED ORDER — HYDROMORPHONE HCL 1 MG/ML IJ SOLN
INTRAMUSCULAR | Status: AC
  Filled 2016-10-07: qty 0.5

## 2016-10-07 MED ORDER — METHOCARBAMOL 500 MG PO TABS: 500.0000 mg | ORAL_TABLET | Freq: Three times a day (TID) | ORAL | 0 refills | Status: DC | PRN

## 2016-10-07 MED ORDER — MIDAZOLAM HCL 2 MG/2ML IJ SOLN
INTRAMUSCULAR | Status: AC
  Filled 2016-10-07: qty 2

## 2016-10-07 MED ORDER — ACETAMINOPHEN 500 MG PO TABS
1000.0000 mg | ORAL_TABLET | ORAL | Status: AC
  Administered 2016-10-07: 1000 mg via ORAL

## 2016-10-07 MED ORDER — FENTANYL CITRATE (PF) 100 MCG/2ML IJ SOLN
50.0000 ug | INTRAMUSCULAR | Status: DC | PRN
  Administered 2016-10-07: 100 ug via INTRAVENOUS

## 2016-10-07 MED ORDER — CEFAZOLIN SODIUM-DEXTROSE 2-4 GM/100ML-% IV SOLN
INTRAVENOUS | Status: AC
  Filled 2016-10-07: qty 100

## 2016-10-07 MED ORDER — GABAPENTIN 300 MG PO CAPS
300.0000 mg | ORAL_CAPSULE | ORAL | Status: AC
  Administered 2016-10-07: 300 mg via ORAL

## 2016-10-07 MED ORDER — METHOCARBAMOL 500 MG PO TABS
500.0000 mg | ORAL_TABLET | Freq: Three times a day (TID) | ORAL | Status: DC | PRN
  Administered 2016-10-07: 500 mg via ORAL
  Filled 2016-10-07: qty 1

## 2016-10-07 MED ORDER — MIDAZOLAM HCL 2 MG/2ML IJ SOLN
1.0000 mg | INTRAMUSCULAR | Status: DC | PRN
  Administered 2016-10-07: 2 mg via INTRAVENOUS
  Administered 2016-10-07: 1 mg via INTRAVENOUS

## 2016-10-07 MED ORDER — HYDROCODONE-ACETAMINOPHEN 5-325 MG PO TABS: 1.0000 | ORAL_TABLET | ORAL | 0 refills | Status: DC | PRN

## 2016-10-07 MED ORDER — HYDROMORPHONE HCL 1 MG/ML IJ SOLN
0.2500 mg | INTRAMUSCULAR | Status: DC | PRN
  Administered 2016-10-07: 0.5 mg via INTRAVENOUS

## 2016-10-07 MED ORDER — ONDANSETRON HCL 4 MG/2ML IJ SOLN: 4.0000 mg | Freq: Four times a day (QID) | INTRAMUSCULAR | Status: DC | PRN

## 2016-10-07 MED ORDER — PROMETHAZINE HCL 25 MG/ML IJ SOLN: 6.2500 mg | INTRAMUSCULAR | Status: DC | PRN

## 2016-10-07 MED ORDER — LIDOCAINE HCL (CARDIAC) 20 MG/ML IV SOLN
INTRAVENOUS | Status: DC | PRN
  Administered 2016-10-07: 50 mg via INTRAVENOUS

## 2016-10-07 MED ORDER — ROCURONIUM BROMIDE 100 MG/10ML IV SOLN
INTRAVENOUS | Status: DC | PRN
  Administered 2016-10-07: 30 mg via INTRAVENOUS
  Administered 2016-10-07: 50 mg via INTRAVENOUS

## 2016-10-07 MED ORDER — PROCHLORPERAZINE EDISYLATE 5 MG/ML IJ SOLN
5.0000 mg | Freq: Four times a day (QID) | INTRAMUSCULAR | Status: DC | PRN
  Administered 2016-10-07: 10 mg via INTRAVENOUS
  Filled 2016-10-07: qty 2

## 2016-10-07 MED ORDER — HYDROCODONE-ACETAMINOPHEN 5-325 MG PO TABS
1.0000 | ORAL_TABLET | ORAL | Status: DC | PRN
  Administered 2016-10-07: 1 via ORAL
  Filled 2016-10-07: qty 1

## 2016-10-07 SURGICAL SUPPLY — 105 items
ALLODERM 16X20 PERF BILATERAL (Tissue) ×6 IMPLANT
APPLIER CLIP 11 MED OPEN (CLIP)
APPLIER CLIP 9.375 MED OPEN (MISCELLANEOUS)
BAG DECANTER FOR FLEXI CONT (MISCELLANEOUS) ×6 IMPLANT
BENZOIN TINCTURE PRP APPL 2/3 (GAUZE/BANDAGES/DRESSINGS) IMPLANT
BINDER BREAST LRG (GAUZE/BANDAGES/DRESSINGS) IMPLANT
BINDER BREAST MEDIUM (GAUZE/BANDAGES/DRESSINGS) IMPLANT
BINDER BREAST XLRG (GAUZE/BANDAGES/DRESSINGS) ×3 IMPLANT
BINDER BREAST XXLRG (GAUZE/BANDAGES/DRESSINGS) IMPLANT
BIOPATCH RED 1 DISK 7.0 (GAUZE/BANDAGES/DRESSINGS) IMPLANT
BLADE CLIPPER SURG (BLADE) IMPLANT
BLADE HEX COATED 2.75 (ELECTRODE) ×3 IMPLANT
BLADE SURG 10 STRL SS (BLADE) ×6 IMPLANT
BLADE SURG 15 STRL LF DISP TIS (BLADE) ×2 IMPLANT
BLADE SURG 15 STRL SS (BLADE) ×1
BNDG GAUZE ELAST 4 BULKY (GAUZE/BANDAGES/DRESSINGS) ×6 IMPLANT
CANISTER SUCT 1200ML W/VALVE (MISCELLANEOUS) ×6 IMPLANT
CHLORAPREP W/TINT 26ML (MISCELLANEOUS) ×6 IMPLANT
CLIP APPLIE 11 MED OPEN (CLIP) IMPLANT
CLIP APPLIE 9.375 MED OPEN (MISCELLANEOUS) IMPLANT
COVER BACK TABLE 60X90IN (DRAPES) ×3 IMPLANT
COVER MAYO STAND STRL (DRAPES) ×6 IMPLANT
COVER PROBE W GEL 5X96 (DRAPES) IMPLANT
DERMABOND ADVANCED (GAUZE/BANDAGES/DRESSINGS)
DERMABOND ADVANCED .7 DNX12 (GAUZE/BANDAGES/DRESSINGS) IMPLANT
DRAIN CHANNEL 15F RND FF W/TCR (WOUND CARE) ×6 IMPLANT
DRAIN CHANNEL 19F RND (DRAIN) ×6 IMPLANT
DRAPE LAPAROSCOPIC ABDOMINAL (DRAPES) IMPLANT
DRAPE TOP ARMCOVERS (MISCELLANEOUS) ×3 IMPLANT
DRAPE U-SHAPE 76X120 STRL (DRAPES) ×3 IMPLANT
DRAPE UTILITY XL STRL (DRAPES) ×3 IMPLANT
DRSG PAD ABDOMINAL 8X10 ST (GAUZE/BANDAGES/DRESSINGS) ×6 IMPLANT
DRSG TEGADERM 4X10 (GAUZE/BANDAGES/DRESSINGS) IMPLANT
DRSG TEGADERM 4X4.75 (GAUZE/BANDAGES/DRESSINGS) ×12 IMPLANT
ELECT BLADE 4.0 EZ CLEAN MEGAD (MISCELLANEOUS) ×3
ELECT BLADE 6.5 .24CM SHAFT (ELECTRODE) ×6 IMPLANT
ELECT COATED BLADE 2.86 ST (ELECTRODE) IMPLANT
ELECT REM PT RETURN 9FT ADLT (ELECTROSURGICAL) ×3
ELECTRODE BLDE 4.0 EZ CLN MEGD (MISCELLANEOUS) ×2 IMPLANT
ELECTRODE REM PT RTRN 9FT ADLT (ELECTROSURGICAL) ×2 IMPLANT
EVACUATOR SILICONE 100CC (DRAIN) ×12 IMPLANT
EXPANDER BREAST TISSUE 400CC (Breast) ×6 IMPLANT
GAUZE SPONGE 4X4 12PLY STRL (GAUZE/BANDAGES/DRESSINGS) IMPLANT
GLOVE BIO SURGEON STRL SZ 6 (GLOVE) IMPLANT
GLOVE BIO SURGEON STRL SZ 6.5 (GLOVE) IMPLANT
GLOVE BIOGEL PI IND STRL 7.0 (GLOVE) ×6 IMPLANT
GLOVE BIOGEL PI IND STRL 7.5 (GLOVE) ×2 IMPLANT
GLOVE BIOGEL PI INDICATOR 7.0 (GLOVE) ×3
GLOVE BIOGEL PI INDICATOR 7.5 (GLOVE) ×1
GLOVE SURG SS PI 6.0 STRL IVOR (GLOVE) ×18 IMPLANT
GLOVE SURG SS PI 6.5 STRL IVOR (GLOVE) ×9 IMPLANT
GLOVE SURG SS PI 7.0 STRL IVOR (GLOVE) ×9 IMPLANT
GOWN SPEC L3 XXLG W/TWL (GOWN DISPOSABLE) ×3 IMPLANT
GOWN STRL REUS W/ TWL LRG LVL3 (GOWN DISPOSABLE) ×10 IMPLANT
GOWN STRL REUS W/TWL LRG LVL3 (GOWN DISPOSABLE) ×5
ILLUMINATOR WAVEGUIDE N/F (MISCELLANEOUS) IMPLANT
IV NS 500ML (IV SOLUTION)
IV NS 500ML BAXH (IV SOLUTION) IMPLANT
KIT FILL SYSTEM UNIVERSAL (SET/KITS/TRAYS/PACK) ×3 IMPLANT
KIT MARKER MARGIN INK (KITS) IMPLANT
LIGHT WAVEGUIDE WIDE FLAT (MISCELLANEOUS) ×3 IMPLANT
LIQUID BAND (GAUZE/BANDAGES/DRESSINGS) ×6 IMPLANT
MARKER SKIN DUAL TIP RULER LAB (MISCELLANEOUS) ×3 IMPLANT
NDL SAFETY ECLIPSE 18X1.5 (NEEDLE) IMPLANT
NEEDLE HYPO 18GX1.5 SHARP (NEEDLE)
NEEDLE HYPO 25X1 1.5 SAFETY (NEEDLE) IMPLANT
NEEDLE INFUSION SET 21GA (NEEDLE) ×3 IMPLANT
NS IRRIG 1000ML POUR BTL (IV SOLUTION) ×6 IMPLANT
PACK BASIN DAY SURGERY FS (CUSTOM PROCEDURE TRAY) ×3 IMPLANT
PACK UNIVERSAL I (CUSTOM PROCEDURE TRAY) IMPLANT
PENCIL BUTTON HOLSTER BLD 10FT (ELECTRODE) ×3 IMPLANT
PIN SAFETY STERILE (MISCELLANEOUS) ×6 IMPLANT
SHEET MEDIUM DRAPE 40X70 STRL (DRAPES) ×6 IMPLANT
SLEEVE SCD COMPRESS KNEE MED (MISCELLANEOUS) ×3 IMPLANT
SPONGE LAP 18X18 X RAY DECT (DISPOSABLE) ×15 IMPLANT
STAPLER VISISTAT 35W (STAPLE) ×3 IMPLANT
STRIP CLOSURE SKIN 1/2X4 (GAUZE/BANDAGES/DRESSINGS) IMPLANT
SUT CHROMIC 4 0 PS 2 18 (SUTURE) ×21 IMPLANT
SUT ETHIBOND 2-0 V-5 NEEDLE (SUTURE) IMPLANT
SUT ETHILON 2 0 FS 18 (SUTURE) ×6 IMPLANT
SUT ETHILON 3 0 PS 1 (SUTURE) IMPLANT
SUT MNCRL AB 3-0 PS2 18 (SUTURE) IMPLANT
SUT MNCRL AB 4-0 PS2 18 (SUTURE) ×12 IMPLANT
SUT MON AB 5-0 PS2 18 (SUTURE) IMPLANT
SUT PDS AB 2-0 CT2 27 (SUTURE) IMPLANT
SUT SILK 2 0 SH (SUTURE) ×6 IMPLANT
SUT SILK 3 0 PS 1 (SUTURE) IMPLANT
SUT VIC AB 0 CT1 27 (SUTURE)
SUT VIC AB 0 CT1 27XBRD ANBCTR (SUTURE) IMPLANT
SUT VIC AB 3-0 SH 27 (SUTURE) ×3
SUT VIC AB 3-0 SH 27X BRD (SUTURE) ×6 IMPLANT
SUT VICRYL 0 CT-2 (SUTURE) ×15 IMPLANT
SUT VICRYL 4-0 PS2 18IN ABS (SUTURE) ×6 IMPLANT
SUT VLOC 180 0 24IN GS25 (SUTURE) ×6 IMPLANT
SYR 50ML LL SCALE MARK (SYRINGE) ×3 IMPLANT
SYR BULB IRRIGATION 50ML (SYRINGE) ×12 IMPLANT
SYR CONTROL 10ML LL (SYRINGE) IMPLANT
TAPE MEASURE VINYL STERILE (MISCELLANEOUS) IMPLANT
TISSUE ALLDRM 16X20 PERF BLTRL (Tissue) ×4 IMPLANT
TOWEL OR 17X24 6PK STRL BLUE (TOWEL DISPOSABLE) ×12 IMPLANT
TRAY FOLEY BAG SILVER LF 14FR (SET/KITS/TRAYS/PACK) ×3 IMPLANT
TRAY FOLEY BAG SILVER LF 16FR (SET/KITS/TRAYS/PACK) IMPLANT
TUBE CONNECTING 20X1/4 (TUBING) ×3 IMPLANT
UNDERPAD 30X30 (UNDERPADS AND DIAPERS) ×3 IMPLANT
YANKAUER SUCT BULB TIP NO VENT (SUCTIONS) ×3 IMPLANT

## 2016-10-07 NOTE — Progress Notes (Signed)
Assisted Dr. Sabra Heck with right, left, ultrasound guided, pectoralis block. Side rails up, monitors on throughout procedure. See vital signs in flow sheet. Tolerated Procedure well.

## 2016-10-07 NOTE — Interval H&P Note (Signed)
History and Physical Interval Note:  10/07/2016 6:56 AM  Anne Fuller  has presented today for surgery, with the diagnosis of BRCA1 POSITIVE   The various methods of treatment have been discussed with the patient and family. After consideration of risks, benefits and other options for treatment, the patient has consented to  Procedure(s): BILATERAL BREAST RECONSTRUCTION WITH PLACEMENT OF TISSUE EXPANDER AND ALLODERM (Bilateral) Skin sparing versus NIPPLE SPARING MASTECTOMY (Bilateral) as a surgical intervention .  The patient's history has been reviewed, patient examined, no change in status, stable for surgery.  I have reviewed the patient's chart and labs.  Questions were answered to the patient's satisfaction.     Chai Verdejo

## 2016-10-07 NOTE — Progress Notes (Signed)
Ambulating patient to bedside commode with husbands assistance and patient sat down and went unresponsive briefly. Quillian Quince, RN and husband got patient to bed and patients bp was 92/62, hr 94, sats 97%. Patient said she felt, "weak". Patient is now alert and oriented. Dr. Iran Planas was called to inform of situation. No new orders were placed at this time. Patient on continuous pulse ox with a saturation of 100% on 2L nasal cannula. Will continue to monitor patient.

## 2016-10-07 NOTE — Transfer of Care (Signed)
Immediate Anesthesia Transfer of Care Note  Patient: Anne Fuller  Procedure(s) Performed: Procedure(s): NIPPLE SPARING MASTECTOMY (Bilateral) BILATERAL BREAST RECONSTRUCTION WITH PLACEMENT OF TISSUE EXPANDER AND ALLODERM (Bilateral)  Patient Location: PACU  Anesthesia Type:General  Level of Consciousness: awake and drowsy  Airway & Oxygen Therapy: Patient Spontanous Breathing and Patient connected to face mask oxygen  Post-op Assessment: Report given to RN and Post -op Vital signs reviewed and stable  Post vital signs: Reviewed and stable  Last Vitals:  Vitals:   10/07/16 0749 10/07/16 0750  BP:    Pulse: 72 77  Resp: 13 14  Temp:      Last Pain:  Vitals:   10/07/16 0704  TempSrc: Oral  PainSc: 0-No pain         Complications: No apparent anesthesia complications

## 2016-10-07 NOTE — Anesthesia Procedure Notes (Signed)
Anesthesia Regional Block: Pectoralis block   Pre-Anesthetic Checklist: ,, timeout performed, Correct Patient, Correct Site, Correct Laterality, Correct Procedure, Correct Position, site marked, Risks and benefits discussed,  Surgical consent,  Pre-op evaluation,  At surgeon's request and post-op pain management  Laterality: Left and Right  Prep: Dura Prep       Needles:  Injection technique: Single-shot  Needle Type: Stimiplex     Needle Length: 9cm  Needle Gauge: 21     Additional Needles:   Procedures: ultrasound guided,,,,,,,,  Narrative:  Start time: 10/07/2016 7:48 AM End time: 10/07/2016 7:53 AM Injection made incrementally with aspirations every 5 mL.  Performed by: Personally  Anesthesiologist: Candida Peeling RAY

## 2016-10-07 NOTE — Op Note (Signed)
Operative Note   DATE OF OPERATION: 7.10.18  LOCATION: [facility]-observation  SURGICAL DIVISION: Plastic Surgery  PREOPERATIVE DIAGNOSES:  1. BRCA1 genetic mutation  POSTOPERATIVE DIAGNOSES:  same  PROCEDURE:  1. Bilateral breast reconstruction with tissue expanders 2. Acellular dermis (Alloderm) for breast reconstruction 600 cm2  SURGEON: Irene Limbo MD MBA  ASSISTANT: none  ANESTHESIA:  General.   EBL: 145 ml for entire procedure  COMPLICATIONS: None immediate.   INDICATIONS FOR PROCEDURE:  The patient, Anne Fuller, is a 41 y.o. female born on 09/18/1975, is here for bilateral prophylactic nipple sparing mastectomies with immediate expander based reconstruction.   FINDINGS: Natrelle 133FV-12-T 400 ml tissue expanders placed bilateral, initial fill volume 360 ml air bilateral. RIGHT SN [id number] LEFT SN [id number]  DESCRIPTION OF PROCEDURE:  Following induction of general anesthesia,  Foley catheter placed. The patient's operative site was prepped and draped in a sterile fashion. A time out was performed and all information was confirmed to be correct. Following completion of mastectomies, reconstruction began on right side.  The cavity was irrigated with solution containing Ancef, gentamicin, and bacitracin. Hemostasis was ensured. A 19 Fr drain was placed in subcutaneous position laterally and a 15 Fr drain placed along inframammary fold. Each secured to skin with 2-0 nylon. Cavity irrigated with Betadine. The tissue expanders were prepared on back table prior in insertion. The expander was filled with air to 320 ml. Perforated acellular dermis was draped over anterior surface expander. The ADM was then secured to itself over posterior surface of expander with 4-0 chromic suture. Redundant folds acellular dermis excised so that the ADM lied flat without folds over air filled expander.The expander was secured to fascia over lateral sternal borderwith a 0 vicryl. The  lateral tab was also secured to pectoralis muscle with 0-vicryl. The ADM was secured to pectoralis muscle and chest wall along inferior border at inframammary fold with 0 V-lock suture. Skin closure completedwith 3-0 vicryl in fascial layer and 4-0 vicryl in dermis. Skin closure completed with 4-0 monocryl subcuticular and tissue adhesive.  I then directed my attention to left chest where similar irrigation and drain placement completed. The prepared expander with ADM secured over anterior surface was placed in right chest and tabs secured to chest wall and pectoralis muscle with 0- vicryl suture. The acellular dermis at inframammary fold was secured to chest wall with 0 V-lock suture. Skin closure completedwith 3-0 vicryl in fascial layer and 4-0 vicryl in dermis. Skin closure completed with 4-0 monocryl subcuticular and tissue adhesive. The mastectomy flaps were redraped so that NAC was symmetric from midline and sternal notch. Tegaderm dressings applied followed by breast binder.  The patient was allowed to wake from anesthesia, extubated and taken to the recovery room in satisfactory condition.   SPECIMENS: none  DRAINS: 15 Fr JP and 19 Fr JP in right and left breast reconstruction  Irene Limbo, MD [facility] Plastic & Reconstructive Surgery [phone], pin [phone]

## 2016-10-07 NOTE — Interval H&P Note (Signed)
History and Physical Interval Note:  10/07/2016 7:31 AM  Anne Fuller  has presented today for surgery, with the diagnosis of BRCA1 POSITIVE   The various methods of treatment have been discussed with the patient and family. After consideration of risks, benefits and other options for treatment, the patient has consented to  Procedure(s): BILATERAL BREAST RECONSTRUCTION WITH PLACEMENT OF TISSUE EXPANDER AND ALLODERM (Bilateral) Skin sparing versus NIPPLE SPARING MASTECTOMY (Bilateral) as a surgical intervention .  The patient's history has been reviewed, patient examined, no change in status, stable for surgery.  I have reviewed the patient's chart and labs.  Questions were answered to the patient's satisfaction.     BYERLY,FAERA   

## 2016-10-07 NOTE — Anesthesia Postprocedure Evaluation (Signed)
Anesthesia Post Note  Patient: Anne Fuller  Procedure(s) Performed: Procedure(s) (LRB): NIPPLE SPARING MASTECTOMY (Bilateral) BILATERAL BREAST RECONSTRUCTION WITH PLACEMENT OF TISSUE EXPANDER AND ALLODERM (Bilateral)     Patient location during evaluation: PACU Anesthesia Type: General Level of consciousness: awake and alert Pain management: pain level controlled Vital Signs Assessment: post-procedure vital signs reviewed and stable Respiratory status: spontaneous breathing, nonlabored ventilation and respiratory function stable Cardiovascular status: blood pressure returned to baseline and stable Postop Assessment: no signs of nausea or vomiting Anesthetic complications: no    Last Vitals:  Vitals:   10/07/16 1301 10/07/16 1315  BP: (!) 107/56 108/65  Pulse: (!) 102 98  Resp: 15 17  Temp: 36.9 C     Last Pain:  Vitals:   10/07/16 1415  TempSrc:   PainSc: Asleep                 Lynda Rainwater

## 2016-10-07 NOTE — Anesthesia Procedure Notes (Signed)
Procedure Name: Intubation Date/Time: 10/07/2016 8:04 AM Performed by: Melynda Ripple D Pre-anesthesia Checklist: Patient identified, Emergency Drugs available, Suction available and Patient being monitored Patient Re-evaluated:Patient Re-evaluated prior to inductionOxygen Delivery Method: Circle system utilized Preoxygenation: Pre-oxygenation with 100% oxygen Intubation Type: IV induction Ventilation: Mask ventilation without difficulty Tube type: Oral Tube size: 7.0 mm Number of attempts: 1 Airway Equipment and Method: Stylet and Oral airway Placement Confirmation: ETT inserted through vocal cords under direct vision,  positive ETCO2 and breath sounds checked- equal and bilateral Secured at: 22 cm Tube secured with: Tape Dental Injury: Teeth and Oropharynx as per pre-operative assessment

## 2016-10-07 NOTE — Op Note (Signed)
Bilateral Nipple Sparing Mastectomy  Indications: This patient presents with history of BRCA-1 genetic mutation with negative clinical exam, mammogram, and MRI  Pre-operative Diagnosis: BRCA-1 mutation  Post-operative Diagnosis: same  Surgeon: Stark Klein   ASSISTANT:  Irene Limbo, MD  Anesthesia: General endotracheal anesthesia and Local anesthesia 0.25.% bupivacaine, with epinephrine  ASA Class: 2  Procedure Details  The patient was seen in the Holding Room. The risks, benefits, complications, treatment options, and expected outcomes were discussed with the patient. The possibilities of reaction to medication, pulmonary aspiration, bleeding, infection, the need for additional procedures, failure to diagnose a condition, and creating a complication requiring transfusion or operation were discussed with the patient. The patient concurred with the proposed plan, giving informed consent.  The site of surgery properly noted/marked. The patient was taken to Operating Room # 6, identified as [place] and the procedure verified as Bilateral Nipple Sparing Mastectomy followed by reconstruction. A Time Out was held and the above information confirmed.   After induction of anesthesia, the bilateral breast and chest were prepped and draped in standard fashion.   The borders of the breast were identified and marked. The right side was addressed first.   A 10 cm inframammary incision was marked out on both sides.  Mastectomy hooks were used to provide elevation of the skin edges, and the cautery was used to create the mastectomy flap.  The lighted retractors were used to assist with visualization.  The breast was very dense and was adherent to the skin.  A lateral axillary incision was made to assist with taking the breast off the skin superolaterally. The dissection was taken to the fascia of the pectoralis major and serratus anterior.  The breast was taken off including the pectoralis  fascia and the axillary tail marked. The nipple was also marked.   A small defect was created in the superolateral skin.    The left side was addressed similarly, but came off the skin much more easily.  A lateral axillary incision was not needed.  This was marked similarly.      The patient was left with Dr. Iran Planas to perform reconstruction.    Findings: grossly clear surgical margins  Estimated Blood Loss:  200 mL         Drains: per Dr. Iran Planas                Specimens: R breast L breast.           Complications:  None; patient tolerated the procedure well.         Disposition: PACU - hemodynamically stable.         Condition: stable

## 2016-10-07 NOTE — Anesthesia Preprocedure Evaluation (Signed)
Anesthesia Evaluation  Patient identified by MRN, date of birth, ID band Patient awake    Reviewed: Allergy & Precautions, NPO status , Patient's Chart, lab work & pertinent test results  Airway Mallampati: II  TM Distance: >3 FB Neck ROM: Full    Dental no notable dental hx.    Pulmonary neg pulmonary ROS,    Pulmonary exam normal breath sounds clear to auscultation       Cardiovascular negative cardio ROS Normal cardiovascular exam Rhythm:Regular Rate:Normal     Neuro/Psych  Headaches, negative neurological ROS  negative psych ROS   GI/Hepatic negative GI ROS, Neg liver ROS,   Endo/Other  negative endocrine ROS  Renal/GU negative Renal ROS  negative genitourinary   Musculoskeletal negative musculoskeletal ROS (+)   Abdominal   Peds negative pediatric ROS (+)  Hematology negative hematology ROS (+)   Anesthesia Other Findings   Reproductive/Obstetrics negative OB ROS                             Anesthesia Physical Anesthesia Plan  ASA: II  Anesthesia Plan: General   Post-op Pain Management:    Induction: Intravenous  PONV Risk Score and Plan: 4 or greater and Ondansetron, Dexamethasone, Propofol, Midazolam and Scopolamine patch - Pre-op  Airway Management Planned: Oral ETT  Additional Equipment:   Intra-op Plan:   Post-operative Plan: Extubation in OR  Informed Consent: I have reviewed the patients History and Physical, chart, labs and discussed the procedure including the risks, benefits and alternatives for the proposed anesthesia with the patient or authorized representative who has indicated his/her understanding and acceptance.   Dental advisory given  Plan Discussed with: CRNA  Anesthesia Plan Comments:         Anesthesia Quick Evaluation

## 2016-10-08 DIAGNOSIS — Z1501 Genetic susceptibility to malignant neoplasm of breast: Secondary | ICD-10-CM | POA: Diagnosis not present

## 2016-10-08 MED ORDER — ACETAMINOPHEN-CODEINE #3 300-30 MG PO TABS: 1.0000 | ORAL_TABLET | ORAL | 0 refills | Status: DC | PRN

## 2016-10-08 NOTE — Discharge Instructions (Signed)
Post Anesthesia Home Care Instructions  Activity: Get plenty of rest for the remainder of the day. A responsible individual must stay with you for 24 hours following the procedure.  For the next 24 hours, DO NOT: -Drive a car -Paediatric nurse -Drink alcoholic beverages -Take any medication unless instructed by your physician -Make any legal decisions or sign important papers.  Meals: Start with liquid foods such as gelatin or soup. Progress to regular foods as tolerated. Avoid greasy, spicy, heavy foods. If nausea and/or vomiting occur, drink only clear liquids until the nausea and/or vomiting subsides. Call your physician if vomiting continues.  Special Instructions/Symptoms: Your throat may feel dry or sore from the anesthesia or the breathing tube placed in your throat during surgery. If this causes discomfort, gargle with warm salt water. The discomfort should disappear within 24 hours.  If you had a scopolamine patch placed behind your ear for the management of post- operative nausea and/or vomiting:  1. The medication in the patch is effective for 72 hours, after which it should be removed.  Wrap patch in a tissue and discard in the trash. Wash hands thoroughly with soap and water. 2. You may remove the patch earlier than 72 hours if you experience unpleasant side effects which may include dry mouth, dizziness or visual disturbances. 3. Avoid touching the patch. Wash your hands with soap and water after contact with the patch.       JP Drain [employer] this sheet to all of your post-operative appointments while you have your drains.  Please measure your drains by CC's or ML's.  Make sure you drain and measure your JP Drains 2 or 3 times per day.  At the end of each day, add up totals for the left side and add up totals for the right side.    ( 9 am )     ( 3 pm )        ( 9 pm )                Date L  R  L  R  L  R  Total L/R                                                                                                                                                                                    JP Drain Totals  Bring this sheet to all of your post-operative appointments while you have your drains.  Please measure your drains by CC's or ML's.  Make sure you drain and measure your JP Drains 2 or 3 times per day.  At the end of each day, add up  totals for the left side and add up totals for the right side.    ( 9 am )     ( 3 pm )        ( 9 pm )                Date L  R  L  R  L  R  Total L/R

## 2016-10-09 ENCOUNTER — Encounter (HOSPITAL_BASED_OUTPATIENT_CLINIC_OR_DEPARTMENT_OTHER): Payer: Self-pay | Admitting: General Surgery

## 2016-11-10 ENCOUNTER — Ambulatory Visit: Payer: BC Managed Care – PPO | Attending: Plastic Surgery | Admitting: Physical Therapy

## 2016-11-10 DIAGNOSIS — M25511 Pain in right shoulder: Secondary | ICD-10-CM | POA: Diagnosis present

## 2016-11-10 DIAGNOSIS — M25611 Stiffness of right shoulder, not elsewhere classified: Secondary | ICD-10-CM | POA: Diagnosis not present

## 2016-11-10 DIAGNOSIS — M25612 Stiffness of left shoulder, not elsewhere classified: Secondary | ICD-10-CM | POA: Insufficient documentation

## 2016-11-10 DIAGNOSIS — M25512 Pain in left shoulder: Secondary | ICD-10-CM | POA: Diagnosis present

## 2016-11-10 NOTE — Therapy (Signed)
[place], [place], [address] Phone: [phone]   Fax:  [phone]  Physical Therapy Evaluation  Patient Details  Name: Anne Fuller MRN: [id number] Date of Birth: July 22, 1975 Referring Provider: Dr. Irene Limbo  Encounter Date: 11/10/2016      PT End of Session - 11/10/16 1249    Visit Number 1   Number of Visits 9   Date for PT Re-Evaluation 12/19/16   PT Start Time 3343  pt. late for appt.   PT Stop Time 1107   PT Time Calculation (min) 37 min   Activity Tolerance Patient tolerated treatment well   Behavior During Therapy WFL for tasks assessed/performed      Past Medical History:  Diagnosis Date  . Allergy   . 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 Complication of anesthesia    difficult to wake up  . Dizziness   . HA (headache)   . High cholesterol     Past Surgical History:  Procedure Laterality Date  . NIPPLE SPARING MASTECTOMY Bilateral 10/07/2016   Procedure: NIPPLE SPARING MASTECTOMY;  Surgeon: Stark Klein, MD;  Location: [place];  Service: General;  Laterality: Bilateral;  . WISDOM TOOTH EXTRACTION      There were no vitals filed for this visit.       Subjective Assessment - 11/10/16 1032    Subjective On July 10th I had a preventative bilateral mastectomy with reconstruction. I'm BRCA 1 positive.  Had multiple family members who have passed or diagnosed with cancer. Having some difficulty with reaching. Gets a pinch just inferior to breast with reaching. Still has a lot of pain and soreness.   Pertinent History BRCA 1 positive with bilateral prophylactic mastectomy on 10/07/16 with no lymph nodes removed.  High cholesterol.  Has had PT in the past and chiropractic for pinched nerve in back; h/o hip bursitis and gets that pain shwen she walks a lot but that doesn't bother her when she weights less.   Patient Stated Goals try to get more flexibility and ROM  with hers arms   Currently in Pain? Yes   Pain Score 2    Pain Location Breast   Pain Orientation Right;Left   Pain Descriptors / Indicators Sore  and intermittent nerve "zaps"   Pain Type Surgical pain   Pain Onset More than a month ago   Aggravating Factors  sleeping on her side, taking deep breaths   Pain Relieving Factors tylenol and advil            [facility] PT Assessment - 11/10/16 0001      Assessment   Medical Diagnosis bilateral prophylactic mastectomies for BRCA1 (+)   Referring Provider Dr. Irene Limbo   Onset Date/Surgical Date 10/07/16   Hand Dominance Right   Prior Therapy none recently     Precautions   Precautions None     Restrictions   Weight Bearing Restrictions No     Balance Screen   Has the patient fallen in the past 6 months No   Has the patient had a decrease in activity level because of a fear of falling?  No   Is the patient reluctant to leave their home because of a fear of falling?  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   Leisure walking daily 30-45 minutes     Cognition   Overall Cognitive Status Within Functional Limits for tasks assessed     Observation/Other Assessments   Skin Integrity incisions well healed; right upper outer breast near axilla incision is still red and drain sites are still red  drains out about two weeks ago     Posture/Postural Control   Posture/Postural Control Postural limitations   Postural Limitations Rounded Shoulders;Forward head  mild     ROM / Strength   AROM / PROM / Strength AROM     AROM   AROM Assessment Site Shoulder   Right/Left Shoulder Right;Left    Right Shoulder Flexion 117 Degrees  limited by pain   Right Shoulder ABduction 98 Degrees   Right Shoulder Internal Rotation --  WFL in supine   Right Shoulder External Rotation 71 Degrees  in supine   Left Shoulder Flexion 136 Degrees   Left Shoulder ABduction 96 Degrees   Left Shoulder Internal Rotation --  WFL in supine   Left Shoulder External Rotation 35 Degrees  in supine     Ambulation/Gait   Ambulation/Gait Yes   Ambulation/Gait Assistance 7: Independent            Objective measurements completed on examination: See above findings.                  PT Education - 11/10/16 1248    Education provided Yes   Education Details standing dowel exercise for shoulder flexion and abduction (each side) and doorway stretch (unilateral and bilateral)   Person(s) Educated Patient   Methods Explanation;Demonstration;Verbal cues;Handout   Comprehension Verbalized understanding;Returned demonstration                [facility] Goals - 11/10/16 1256      CC Long Term Goal  #1   Title Pt. will show at least 150 degrees of active shoulder flexion bilaterally for improved overhead reach   Time 4   Period Weeks   Status New   Target Date 12/11/16     CC Long Term Goal  #2   Title Pt. will show at least 160 degrees of active shoulder abduction bilaterally for improved ADLs   Time 4   Period Weeks   Status New   Target Date 12/11/16     CC Long Term Goal  #3   Title Pt. will show at least 85 degrees shoulder er bilaterally for improved ADLs   Time 4   Period Weeks   Status New   Target Date 12/11/16     CC Long Term Goal  #4   Title Independent in HEP for shoulder ROM and strengthening   Time 3   Period Weeks   Status New   Target Date 12/11/16     CC Long Term Goal  #5   Title Will report at least 50% decrease in discomfort with reaching with each arm.   Time 4   Period Weeks   Status New             Plan - 11/10/16 1252     Clinical Impression Statement This is a pleasant woman about a month post bilateral prophylactic mastectomy because she is BRCA 1 positive and she has had numerous family members affected.  She  had immediate reconstruction and has expanders in place, so will have replacement surgery in October; she will have oophorectomy in September.  She has moderately limited shoulder ROM bilaterally at this time and has not been doing any stretching; she also has discomfort related to soft tissue pulling when she moves her arm.s   Clinical Decision Making Low   Rehab Potential Excellent   Clinical Impairments Affecting Rehab Potential none   PT Frequency 2x / week   PT Duration 4 weeks   PT Treatment/Interventions ADLs/Self Care Home Management;Therapeutic exercise;Patient/family education;Manual techniques;Manual lymph drainage;Scar mobilization;Passive range of motion   PT Next Visit Plan review HEP as needed; begin P/Aa/A/ROM to both shoulders with soft tissue mobilization/myofascial release as appropriate   PT Home Exercise Plan dowel and doorway exercises   Consulted and Agree with Plan of Care Patient      Patient will benefit from skilled therapeutic intervention in order to improve the following deficits and impairments:  Decreased range of motion, Impaired UE functional use, Pain, Decreased scar mobility, Increased edema  Visit Diagnosis: Stiffness of right shoulder, not elsewhere classified - Plan: PT plan of care cert/re-cert  Stiffness of left shoulder, not elsewhere classified - Plan: PT plan of care cert/re-cert  Right shoulder pain, unspecified chronicity - Plan: PT plan of care cert/re-cert  Left shoulder pain, unspecified chronicity - Plan: PT plan of care cert/re-cert     Problem List Patient Active Problem List   Diagnosis Date Noted  . BRCA gene mutation positive 10/07/2016  . Headache(784.0) 03/09/2013  . 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

## 2016-11-11 ENCOUNTER — Encounter: Payer: Self-pay | Admitting: Physical Therapy

## 2016-11-11 ENCOUNTER — Ambulatory Visit: Payer: BC Managed Care – PPO | Admitting: Physical Therapy

## 2016-11-11 DIAGNOSIS — M25611 Stiffness of right shoulder, not elsewhere classified: Secondary | ICD-10-CM | POA: Diagnosis not present

## 2016-11-11 DIAGNOSIS — M25612 Stiffness of left shoulder, not elsewhere classified: Secondary | ICD-10-CM

## 2016-11-11 NOTE — Therapy (Signed)
[place], [place], [address] Phone: [phone]   Fax:  [phone]  Physical Therapy Treatment  Patient Details  Name: Anne Fuller MRN: [id number] Date of Birth: 1976/01/27 Referring Provider: Dr. Irene Limbo  Encounter Date: 11/11/2016      PT End of Session - 11/11/16 1804    Visit Number 2   Number of Visits 9   Date for PT Re-Evaluation 12/19/16   PT Start Time 1359  pt arrived late   PT Stop Time 1438   PT Time Calculation (min) 39 min   Activity Tolerance Patient tolerated treatment well   Behavior During Therapy [facility] for tasks assessed/performed      Past Medical History:  Diagnosis Date  . Allergy   . 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 Complication of anesthesia    difficult to wake up  . Dizziness   . HA (headache)   . High cholesterol     Past Surgical History:  Procedure Laterality Date  . NIPPLE SPARING MASTECTOMY Bilateral 10/07/2016   Procedure: NIPPLE SPARING MASTECTOMY;  Surgeon: Stark Klein, MD;  Location: [place];  Service: General;  Laterality: Bilateral;  . WISDOM TOOTH EXTRACTION      There were no vitals filed for this visit.      Subjective Assessment - 11/11/16 1400    Subjective I was just here yesterday for my evaluation. I did the exercises last night but I have not done them yet today.    Pertinent History BRCA 1 positive with bilateral prophylactic mastectomy on 10/07/16 with no lymph nodes removed.  High cholesterol.  Has had PT in the past and chiropractic for pinched nerve in back; h/o hip bursitis and gets that pain shwen she walks a lot but that doesn't bother her when she weights less.   Patient Stated Goals try to get more flexibility and ROM with hers arms   Currently in Pain? Yes   Pain Score 2    Pain Location Breast   Pain Orientation Right;Left   Pain Descriptors / Indicators Sore   Pain Type Surgical pain                          [facility] Adult PT Treatment/Exercise - 11/11/16 0001      Manual Therapy   Manual Therapy Passive ROM;Myofascial release   Myofascial Release across upper abdominal area while abducting arm in area of pain, gentle cross friction using fingers over area of pain in upper abdomen   Passive ROM to bilateral shoulders in direction of flexion, abduction                PT Education - 11/10/16 1248    Education provided Yes   Education Details standing dowel exercise for shoulder flexion and abduction (each side) and doorway stretch (unilateral and bilateral)   Person(s) Educated Patient   Methods Explanation;Demonstration;Verbal cues;Handout   Comprehension Verbalized understanding;Returned demonstration                [facility] Goals - 11/10/16 1256      CC Long Term Goal  #1   Title Pt. will show at least 150 degrees of active shoulder flexion bilaterally for improved overhead reach   Time 4   Period Weeks   Status New   Target Date 12/11/16     CC Long Term Goal  #2   Title Pt. will show at  least 160 degrees of active shoulder abduction bilaterally for improved ADLs   Time 4   Period Weeks   Status New   Target Date 12/11/16     CC Long Term Goal  #3   Title Pt. will show at least 64 degrees shoulder er bilaterally for improved ADLs   Time 4   Period Weeks   Status New   Target Date 12/11/16     CC Long Term Goal  #4   Title Independent in HEP for shoulder ROM and strengthening   Time 3   Period Weeks   Status New   Target Date 12/11/16     CC Long Term Goal  #5   Title Will report at least 50% decrease in discomfort with reaching with each arm.   Time 4   Period Weeks   Status New            Plan - 11/11/16 1805    Clinical Impression Statement Today focused on PROM to bilateral shoulders in direction of flexion and abduction. Pt limited by pain during PROM but did demonstrate improvement from  beginning of session. Also performed myofascial release to area of tightness and pain in upper abdomen that pt feels when abduction and flexing bilteral UEs.    Rehab Potential Excellent   Clinical Impairments Affecting Rehab Potential none   PT Frequency 2x / week   PT Duration 4 weeks   PT Treatment/Interventions ADLs/Self Care Home Management;Therapeutic exercise;Patient/family education;Manual techniques;Manual lymph drainage;Scar mobilization;Passive range of motion   PT Next Visit Plan review HEP as needed; begin P/Aa/A/ROM to both shoulders with soft tissue mobilization/myofascial release as appropriate   PT Home Exercise Plan dowel and doorway exercises   Consulted and Agree with Plan of Care Patient      Patient will benefit from skilled therapeutic intervention in order to improve the following deficits and impairments:  Decreased range of motion, Impaired UE functional use, Pain, Decreased scar mobility, Increased edema  Visit Diagnosis: Stiffness of right shoulder, not elsewhere classified  Stiffness of left shoulder, not elsewhere classified     Problem List Patient Active Problem List   Diagnosis Date Noted  . BRCA gene mutation positive 10/07/2016  . Headache(784.0) 03/09/2013  . 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 11/11/2016, 6:07 PM  [place], [place], [address] Phone: [phone]   Fax:  [phone]  Name: Anne Fuller MRN: [id number] Date of Birth: 07-22-1975  Manus Gunning, PT 11/11/16 6:07 PM

## 2016-11-13 ENCOUNTER — Encounter: Payer: Self-pay | Admitting: Physical Therapy

## 2016-11-13 ENCOUNTER — Ambulatory Visit: Payer: BC Managed Care – PPO | Admitting: Physical Therapy

## 2016-11-13 DIAGNOSIS — M25612 Stiffness of left shoulder, not elsewhere classified: Secondary | ICD-10-CM

## 2016-11-13 DIAGNOSIS — M25611 Stiffness of right shoulder, not elsewhere classified: Secondary | ICD-10-CM

## 2016-11-13 DIAGNOSIS — M25511 Pain in right shoulder: Secondary | ICD-10-CM

## 2016-11-13 DIAGNOSIS — M25512 Pain in left shoulder: Secondary | ICD-10-CM

## 2016-11-13 NOTE — Therapy (Signed)
[place], [place], [address] Phone: [phone]   Fax:  [phone]  Physical Therapy Treatment  Patient Details  Name: Anne Fuller MRN: [id number] Date of Birth: April 24, 1975 Referring Provider: Dr. Irene Limbo  Encounter Date: 11/13/2016      PT End of Session - 11/13/16 1612    Visit Number 3   Number of Visits 9   Date for PT Re-Evaluation 12/19/16   PT Start Time 9563   PT Stop Time 1601   PT Time Calculation (min) 47 min   Activity Tolerance Patient tolerated treatment well   Behavior During Therapy [facility] for tasks assessed/performed      Past Medical History:  Diagnosis Date  . Allergy   . 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 Complication of anesthesia    difficult to wake up  . Dizziness   . HA (headache)   . High cholesterol     Past Surgical History:  Procedure Laterality Date  . NIPPLE SPARING MASTECTOMY Bilateral 10/07/2016   Procedure: NIPPLE SPARING MASTECTOMY;  Surgeon: Stark Klein, MD;  Location: [place];  Service: General;  Laterality: Bilateral;  . WISDOM TOOTH EXTRACTION      There were no vitals filed for this visit.      Subjective Assessment - 11/13/16 1514    Subjective I was not sore after the stretching. I did my exercises last night. I have been reaching up over my head a lot to pack my closet.    Pertinent History BRCA 1 positive with bilateral prophylactic mastectomy on 10/07/16 with no lymph nodes removed.  High cholesterol.  Has had PT in the past and chiropractic for pinched nerve in back; h/o hip bursitis and gets that pain shwen she walks a lot but that doesn't bother her when she weights less.   Patient Stated Goals try to get more flexibility and ROM with hers arms   Currently in Pain? Yes   Pain Score 2    Pain Location Breast   Pain Orientation Right;Left   Pain Descriptors / Indicators Sore   Pain Type Surgical pain             [facility] PT Assessment - 11/13/16 0001      AROM   Right Shoulder Flexion 136 Degrees  150 after stretching   Right Shoulder ABduction 114 Degrees  125 after stretching   Left Shoulder Flexion 153 Degrees   Left Shoulder ABduction 152 Degrees                     [facility] Adult PT Treatment/Exercise - 11/13/16 0001      Manual Therapy   Manual Therapy Passive ROM;Myofascial release   Myofascial Release across upper abdominal area while abducting arm in area of pain, gentle cross friction using fingers over area of pain in upper abdomen   Passive ROM to bilateral shoulders in direction of flexion, abduction                        [facility] Goals - 11/10/16 1256      CC Long Term Goal  #1   Title Pt. will show at least 150 degrees of active shoulder flexion bilaterally for improved overhead reach   Time 4   Period Weeks   Status New   Target Date 12/11/16     CC Long Term Goal  #2   Title Pt. will  show at least 160 degrees of active shoulder abduction bilaterally for improved ADLs   Time 4   Period Weeks   Status New   Target Date 12/11/16     CC Long Term Goal  #3   Title Pt. will show at least 70 degrees shoulder er bilaterally for improved ADLs   Time 4   Period Weeks   Status New   Target Date 12/11/16     CC Long Term Goal  #4   Title Independent in HEP for shoulder ROM and strengthening   Time 3   Period Weeks   Status New   Target Date 12/11/16     CC Long Term Goal  #5   Title Will report at least 50% decrease in discomfort with reaching with each arm.   Time 4   Period Weeks   Status New            Plan - 11/13/16 1613    Clinical Impression Statement Measured pt's AROM today prior to stretching. It has improved greatly since time of evaluation. Continued with PROM today and myofascial to area of soreness in upper abdomen. Remeasured AROM following stretching and it improved further.   Rehab Potential  Excellent   Clinical Impairments Affecting Rehab Potential none   PT Frequency 2x / week   PT Duration 4 weeks   PT Treatment/Interventions ADLs/Self Care Home Management;Therapeutic exercise;Patient/family education;Manual techniques;Manual lymph drainage;Scar mobilization;Passive range of motion   PT Next Visit Plan review HEP as needed; continue P/Aa/A/ROM to both shoulders with soft tissue mobilization/myofascial release as appropriate   PT Home Exercise Plan dowel and doorway exercises   Consulted and Agree with Plan of Care Patient      Patient will benefit from skilled therapeutic intervention in order to improve the following deficits and impairments:  Decreased range of motion, Impaired UE functional use, Pain, Decreased scar mobility, Increased edema  Visit Diagnosis: Stiffness of right shoulder, not elsewhere classified  Stiffness of left shoulder, not elsewhere classified  Right shoulder pain, unspecified chronicity  Left shoulder pain, unspecified chronicity     Problem List Patient Active Problem List   Diagnosis Date Noted  . BRCA gene mutation positive 10/07/2016  . Headache(784.0) 03/09/2013  . 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

## 2016-11-17 ENCOUNTER — Ambulatory Visit: Payer: BC Managed Care – PPO | Admitting: Physical Therapy

## 2016-11-17 ENCOUNTER — Encounter: Payer: Self-pay | Admitting: Physical Therapy

## 2016-11-17 ENCOUNTER — Encounter (HOSPITAL_BASED_OUTPATIENT_CLINIC_OR_DEPARTMENT_OTHER): Payer: Self-pay | Admitting: *Deleted

## 2016-11-17 DIAGNOSIS — M25611 Stiffness of right shoulder, not elsewhere classified: Secondary | ICD-10-CM | POA: Diagnosis not present

## 2016-11-17 DIAGNOSIS — M25512 Pain in left shoulder: Secondary | ICD-10-CM

## 2016-11-17 DIAGNOSIS — M25612 Stiffness of left shoulder, not elsewhere classified: Secondary | ICD-10-CM

## 2016-11-17 DIAGNOSIS — M25511 Pain in right shoulder: Secondary | ICD-10-CM

## 2016-11-17 NOTE — Patient Instructions (Signed)

## 2016-11-17 NOTE — Therapy (Signed)
[place], [place], [address] Phone: [phone]   Fax:  [phone]  Physical Therapy Treatment  Patient Details  Name: Anne Fuller MRN: [id number] Date of Birth: December 01, 1975 Referring Provider: Dr. Irene Limbo  Encounter Date: 11/17/2016      PT End of Session - 11/17/16 1214    Visit Number 4   Number of Visits 9   Date for PT Re-Evaluation 12/19/16   PT Start Time 0933   PT Stop Time 1015   PT Time Calculation (min) 42 min   Activity Tolerance Patient tolerated treatment well   Behavior During Therapy [facility] for tasks assessed/performed      Past Medical History:  Diagnosis Date  . Allergy   . 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 Complication of anesthesia    difficult to wake up  . Dizziness   . HA (headache)   . High cholesterol     Past Surgical History:  Procedure Laterality Date  . NIPPLE SPARING MASTECTOMY Bilateral 10/07/2016   Procedure: NIPPLE SPARING MASTECTOMY;  Surgeon: Stark Klein, MD;  Location: [place];  Service: General;  Laterality: Bilateral;  . WISDOM TOOTH EXTRACTION      There were no vitals filed for this visit.      Subjective Assessment - 11/17/16 0954    Subjective For the past 3 months I have not taken pain medication. I was a little sore after last session but not bad.    Pertinent History BRCA 1 positive with bilateral prophylactic mastectomy on 10/07/16 with no lymph nodes removed.  High cholesterol.  Has had PT in the past and chiropractic for pinched nerve in back; h/o hip bursitis and gets that pain shwen she walks a lot but that doesn't bother her when she weights less.   Patient Stated Goals try to get more flexibility and ROM with hers arms   Currently in Pain? Yes   Pain Score 2    Pain Location Breast   Pain Orientation Left;Right   Pain Descriptors / Indicators Discomfort   Pain Type Surgical pain            [facility] PT  Assessment - 11/17/16 0001      AROM   Right Shoulder Flexion 144 Degrees   Right Shoulder ABduction 116 Degrees   Left Shoulder Flexion 156 Degrees   Left Shoulder ABduction 143 Degrees                     [facility] Adult PT Treatment/Exercise - 11/17/16 0001      Shoulder Exercises: Supine   Horizontal ABduction Strengthening;Both;10 reps;Theraband   Theraband Level (Shoulder Horizontal ABduction) Level 1 (Yellow)   External Rotation Strengthening;Both;10 reps;Theraband   Theraband Level (Shoulder External Rotation) Level 1 (Yellow)   Flexion Strengthening;Both;10 reps;Theraband  narrow and wide grip   Theraband Level (Shoulder Flexion) Level 1 (Yellow)   Other Supine Exercises D2 x 10 reps bilaterally with yellow theraband     Manual Therapy   Manual Therapy Passive ROM   Passive ROM to right shoulder in direction of flexion and abduction                        [facility] Goals - 11/10/16 1256      CC Long Term Goal  #1   Title Pt. will show at least 150 degrees of active shoulder flexion bilaterally for improved overhead reach  Time 4   Period Weeks   Status New   Target Date 12/11/16     CC Long Term Goal  #2   Title Pt. will show at least 160 degrees of active shoulder abduction bilaterally for improved ADLs   Time 4   Period Weeks   Status New   Target Date 12/11/16     CC Long Term Goal  #3   Title Pt. will show at least 67 degrees shoulder er bilaterally for improved ADLs   Time 4   Period Weeks   Status New   Target Date 12/11/16     CC Long Term Goal  #4   Title Independent in HEP for shoulder ROM and strengthening   Time 3   Period Weeks   Status New   Target Date 12/11/16     CC Long Term Goal  #5   Title Will report at least 50% decrease in discomfort with reaching with each arm.   Time 4   Period Weeks   Status New            Plan - 11/17/16 1214    Clinical Impression Statement Issued supine scapular  exercises today to help improve ROM, strength and posture. Issued these to pt's home exercise program. Pt did not have any soreness in upper abdominal area today. She also has not taken pain medications for the last three days. Her AROM continues to improve. Her right shoulder is more limited than the left so focus was placed on PROM to right side only today.   Rehab Potential Excellent   Clinical Impairments Affecting Rehab Potential none   PT Frequency 2x / week   PT Duration 4 weeks   PT Treatment/Interventions ADLs/Self Care Home Management;Therapeutic exercise;Patient/family education;Manual techniques;Manual lymph drainage;Scar mobilization;Passive range of motion   PT Next Visit Plan assess indep with supine scap series, add pulleys and ball up wall- review HEP as needed; continue P/Aa/A/ROM to both shoulders - focus on R with soft tissue mobilization/myofascial release as appropriate   PT Home Exercise Plan dowel and doorway exercises, supine scap   Consulted and Agree with Plan of Care Patient      Patient will benefit from skilled therapeutic intervention in order to improve the following deficits and impairments:  Decreased range of motion, Impaired UE functional use, Pain, Decreased scar mobility, Increased edema  Visit Diagnosis: Stiffness of right shoulder, not elsewhere classified  Stiffness of left shoulder, not elsewhere classified  Right shoulder pain, unspecified chronicity  Left shoulder pain, unspecified chronicity     Problem List Patient Active Problem List   Diagnosis Date Noted  . BRCA gene mutation positive 10/07/2016  . Headache(784.0) 03/09/2013  . 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

## 2016-11-18 ENCOUNTER — Encounter (HOSPITAL_BASED_OUTPATIENT_CLINIC_OR_DEPARTMENT_OTHER): Payer: Self-pay | Admitting: *Deleted

## 2016-11-18 NOTE — Progress Notes (Signed)
NPO AFTER MN.  ARRIVE AT 0600.  GETTING CBC AND T&S DONE Monday 11-24-2016 OR Tuesday 11-25-2016.  NEEDS URINE PREG. ON ARRIVAL.

## 2016-11-19 ENCOUNTER — Ambulatory Visit: Payer: BC Managed Care – PPO | Admitting: Physical Therapy

## 2016-11-19 ENCOUNTER — Encounter: Payer: Self-pay | Admitting: Physical Therapy

## 2016-11-19 DIAGNOSIS — M25512 Pain in left shoulder: Secondary | ICD-10-CM

## 2016-11-19 DIAGNOSIS — M25611 Stiffness of right shoulder, not elsewhere classified: Secondary | ICD-10-CM | POA: Diagnosis not present

## 2016-11-19 DIAGNOSIS — M25612 Stiffness of left shoulder, not elsewhere classified: Secondary | ICD-10-CM

## 2016-11-19 DIAGNOSIS — M25511 Pain in right shoulder: Secondary | ICD-10-CM

## 2016-11-19 NOTE — Therapy (Signed)
[facility] [address] [place], [place], [address] Phone: [phone]   Fax:  [phone]  Physical Therapy Treatment  Patient Details  Name: Anne Fuller MRN: [id number] Date of Birth: 03-05-1976 Referring Provider: Dr. Glenna Fellows  Encounter Date: 11/19/2016      PT End of Session - 11/19/16 1501    Visit Number 5   Number of Visits 9   Date for PT Re-Evaluation 12/19/16   PT Start Time 1411   PT Stop Time 1458   PT Time Calculation (min) 47 min   Activity Tolerance Patient tolerated treatment well   Behavior During Therapy [facility] for tasks assessed/performed      Past Medical History:  Diagnosis Date  . 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 Complication of anesthesia    difficult to wake up  . High cholesterol   . PONV (postoperative nausea and vomiting)     Past Surgical History:  Procedure Laterality Date  . NIPPLE SPARING MASTECTOMY Bilateral 10/07/2016   Procedure: NIPPLE SPARING MASTECTOMY;  Surgeon: Almond Lint, MD;  Location: [facility];  Service: General;  Laterality: Bilateral;  . WISDOM TOOTH EXTRACTION      There were no vitals filed for this visit.      Subjective Assessment - 11/19/16 1414    Subjective I had some pain last night in my right side. I think it is scar tissue.    Pertinent History BRCA 1 positive with bilateral prophylactic mastectomy on 10/07/16 with no lymph nodes removed.  High cholesterol.  Has had PT in the past and chiropractic for pinched nerve in back; h/o hip bursitis and gets that pain shwen she walks a lot but that doesn't bother her when she weights less.   Patient Stated Goals try to get more flexibility and ROM with hers arms   Currently in Pain? No/denies   Pain Score 0-No pain                         [facility] Adult PT Treatment/Exercise - 11/19/16 0001      Shoulder Exercises: Pulleys   Flexion 2 minutes   ABduction 2 minutes      Shoulder Exercises: Therapy Ball   Flexion 10 reps  with stretch at end range     Manual Therapy   Myofascial Release left, then right UE myofascial pulling with movement into abduction; crosshands with one hand at right upper arm and concurrent distraction at right flank   Passive ROM to right shoulder with stretch to tolerance into er, abduction, and flexion, then same on left;                         Long Term Clinic Goals - 11/19/16 1504      CC Long Term Goal  #1   Title Pt. will show at least 150 degrees of active shoulder flexion bilaterally for improved overhead reach   Status On-going     CC Long Term Goal  #2   Title Pt. will show at least 160 degrees of active shoulder abduction bilaterally for improved ADLs   Status On-going     CC Long Term Goal  #3   Title Pt. will show at least 85 degrees shoulder er bilaterally for improved ADLs   Status On-going     CC Long Term Goal  #4   Title Independent in HEP for shoulder ROM and strengthening  Status Partially Met     CC Long Term Goal  #5   Title Will report at least 50% decrease in discomfort with reaching with each arm.   Status On-going            Plan - 11/19/16 1501    Clinical Impression Statement Patient doing well but still with tightness and P/ROM limited by pain.  She will have oophorectomy soon, so will decrease frequency of PT after next week.     Rehab Potential Excellent   PT Frequency 2x / week   PT Duration 4 weeks   PT Treatment/Interventions ADLs/Self Care Home Management;Therapeutic exercise;Patient/family education;Manual techniques;Manual lymph drainage;Scar mobilization;Passive range of motion   PT Next Visit Plan Check goals. Review, ADD to HEP as appropriate, especially given that patient will be coming less frequently to PT after next week between having abdominal surgery and then returning to work.  Continue ROM to both shoulders with focus on right.   PT Home Exercise  Plan dowel and doorway exercises, supine scap   Consulted and Agree with Plan of Care Patient      Patient will benefit from skilled therapeutic intervention in order to improve the following deficits and impairments:  Decreased range of motion, Impaired UE functional use, Pain, Decreased scar mobility, Increased edema  Visit Diagnosis: Stiffness of right shoulder, not elsewhere classified  Stiffness of left shoulder, not elsewhere classified  Right shoulder pain, unspecified chronicity  Left shoulder pain, unspecified chronicity     Problem List Patient Active Problem List   Diagnosis Date Noted  . BRCA gene mutation positive 10/07/2016  . Headache(784.0) 03/09/2013  . 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  Serafina Royals, PT 11/19/16 3:06 PM

## 2016-11-24 ENCOUNTER — Encounter: Payer: Self-pay | Admitting: Physical Therapy

## 2016-11-24 ENCOUNTER — Ambulatory Visit: Payer: BC Managed Care – PPO | Admitting: Physical Therapy

## 2016-11-24 DIAGNOSIS — N83201 Unspecified ovarian cyst, right side: Secondary | ICD-10-CM | POA: Diagnosis not present

## 2016-11-24 DIAGNOSIS — E7800 Pure hypercholesterolemia, unspecified: Secondary | ICD-10-CM | POA: Diagnosis not present

## 2016-11-24 DIAGNOSIS — Z9013 Acquired absence of bilateral breasts and nipples: Secondary | ICD-10-CM | POA: Diagnosis not present

## 2016-11-24 DIAGNOSIS — Z4002 Encounter for prophylactic removal of ovary: Secondary | ICD-10-CM | POA: Diagnosis not present

## 2016-11-24 DIAGNOSIS — Z803 Family history of malignant neoplasm of breast: Secondary | ICD-10-CM | POA: Diagnosis not present

## 2016-11-24 DIAGNOSIS — M25511 Pain in right shoulder: Secondary | ICD-10-CM

## 2016-11-24 DIAGNOSIS — M25512 Pain in left shoulder: Secondary | ICD-10-CM

## 2016-11-24 DIAGNOSIS — Z885 Allergy status to narcotic agent status: Secondary | ICD-10-CM | POA: Diagnosis not present

## 2016-11-24 DIAGNOSIS — Z1501 Genetic susceptibility to malignant neoplasm of breast: Secondary | ICD-10-CM | POA: Diagnosis not present

## 2016-11-24 DIAGNOSIS — Z91040 Latex allergy status: Secondary | ICD-10-CM | POA: Diagnosis not present

## 2016-11-24 DIAGNOSIS — N83202 Unspecified ovarian cyst, left side: Secondary | ICD-10-CM | POA: Diagnosis not present

## 2016-11-24 DIAGNOSIS — M25612 Stiffness of left shoulder, not elsewhere classified: Secondary | ICD-10-CM

## 2016-11-24 DIAGNOSIS — M25611 Stiffness of right shoulder, not elsewhere classified: Secondary | ICD-10-CM | POA: Diagnosis not present

## 2016-11-24 LAB — TYPE AND SCREEN
ABO/RH(D): A POS
ANTIBODY SCREEN: NEGATIVE

## 2016-11-24 LAB — CBC
HCT: 34.7 % — ABNORMAL LOW (ref 36.0–46.0)
Hemoglobin: 11.4 g/dL — ABNORMAL LOW (ref 12.0–15.0)
MCH: 25.7 pg — ABNORMAL LOW (ref 26.0–34.0)
MCHC: 32.9 g/dL (ref 30.0–36.0)
MCV: 78.3 fL (ref 78.0–100.0)
PLATELETS: 366 10*3/uL (ref 150–400)
RBC: 4.43 MIL/uL (ref 3.87–5.11)
RDW: 13 % (ref 11.5–15.5)
WBC: 6.1 10*3/uL (ref 4.0–10.5)

## 2016-11-24 LAB — ABO/RH: ABO/RH(D): A POS

## 2016-11-24 NOTE — Therapy (Signed)
[place], [place], [address] Phone: [phone]   Fax:  [phone]  Physical Therapy Treatment  Patient Details  Name: Anne Fuller MRN: [id number] Date of Birth: 07-16-1975 Referring Provider: Dr. Irene Limbo  Encounter Date: 11/24/2016      PT End of Session - 11/24/16 1615    Visit Number 6   Number of Visits 9   Date for PT Re-Evaluation 12/19/16   PT Start Time 5638   PT Stop Time 1519   PT Time Calculation (min) 45 min   Activity Tolerance Patient tolerated treatment well   Behavior During Therapy [facility] for tasks assessed/performed      Past Medical History:  Diagnosis Date  . 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 Complication of anesthesia    difficult to wake up  . High cholesterol   . PONV (postoperative nausea and vomiting)     Past Surgical History:  Procedure Laterality Date  . NIPPLE SPARING MASTECTOMY Bilateral 10/07/2016   Procedure: NIPPLE SPARING MASTECTOMY;  Surgeon: Stark Klein, MD;  Location: [place];  Service: General;  Laterality: Bilateral;  . WISDOM TOOTH EXTRACTION      There were no vitals filed for this visit.      Subjective Assessment - 11/24/16 1437    Subjective The right shoulder is tighter than the left shoulder.    Pertinent History BRCA 1 positive with bilateral prophylactic mastectomy on 10/07/16 with no lymph nodes removed.  High cholesterol.  Has had PT in the past and chiropractic for pinched nerve in back; h/o hip bursitis and gets that pain shwen she walks a lot but that doesn't bother her when she weights less.   Patient Stated Goals try to get more flexibility and ROM with hers arms   Currently in Pain? No/denies   Pain Score 0-No pain            [facility] PT Assessment - 11/24/16 0001      AROM   Right Shoulder ABduction 145 Degrees   Left Shoulder ABduction 174 Degrees                     [facility] Adult  PT Treatment/Exercise - 11/24/16 0001      Shoulder Exercises: Standing   External Rotation Strengthening;Both;10 reps;Theraband   Theraband Level (Shoulder External Rotation) Level 1 (Yellow)   Internal Rotation Strengthening;Both;10 reps;Theraband   Theraband Level (Shoulder Internal Rotation) Level 1 (Yellow)   Flexion Strengthening;Both;10 reps;Theraband   Theraband Level (Shoulder Flexion) Level 1 (Yellow)   Extension Strengthening;Both;10 reps;Theraband   Theraband Level (Shoulder Extension) Level 1 (Yellow)     Shoulder Exercises: Pulleys   Flexion 2 minutes   ABduction 2 minutes     Shoulder Exercises: Therapy Ball   Flexion 10 reps  with stretch at end range     Manual Therapy   Passive ROM to right shoulder with stretch to tolerance into er, abduction, and flexion, then same on left;                         [facility] Goals - 11/24/16 1614      CC Long Term Goal  #1   Title Pt. will show at least 150 degrees of active shoulder flexion bilaterally for improved overhead reach   Baseline R 144 degrees, L 156 degrees   Time 4   Period Weeks   Status Partially Met  CC Long Term Goal  #2   Title Pt. will show at least 160 degrees of active shoulder abduction bilaterally for improved ADLs   Baseline R 145 degrees, L 174 degrees   Time 4   Period Weeks   Status Partially Met     CC Long Term Goal  #3   Title Pt. will show at least 85 degrees shoulder er bilaterally for improved ADLs   Time 4   Period Weeks   Status On-going     CC Long Term Goal  #4   Title Independent in HEP for shoulder ROM and strengthening   Time 3   Period Weeks   Status Partially Met     CC Long Term Goal  #5   Title Will report at least 50% decrease in discomfort with reaching with each arm.   Time 4   Period Weeks   Status On-going            Plan - 11/24/16 1616    Clinical Impression Statement Added new standing theraband strengthening exercises  today. Pt continues to demonstrate improvement with ROM in bilateral shoulders. Her right shoulder is more limited than her left. Pt will have oophorectomy on Wednesday so will not be back this week.    Rehab Potential Excellent   Clinical Impairments Affecting Rehab Potential none   PT Frequency 2x / week   PT Duration 4 weeks   PT Next Visit Plan Review, ADD to HEP as appropriate, especially given that patient will be coming less frequently to PT after next week between having abdominal surgery and then returning to work.  Continue ROM to both shoulders with focus on right.   PT Home Exercise Plan dowel and doorway exercises, supine scap, rockwood exercises   Consulted and Agree with Plan of Care Patient      Patient will benefit from skilled therapeutic intervention in order to improve the following deficits and impairments:  Decreased range of motion, Impaired UE functional use, Pain, Decreased scar mobility, Increased edema  Visit Diagnosis: Stiffness of right shoulder, not elsewhere classified  Stiffness of left shoulder, not elsewhere classified  Right shoulder pain, unspecified chronicity  Left shoulder pain, unspecified chronicity     Problem List Patient Active Problem List   Diagnosis Date Noted  . BRCA gene mutation positive 10/07/2016  . Headache(784.0) 03/09/2013  . 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

## 2016-11-24 NOTE — Patient Instructions (Signed)
Strengthening: Resisted Flexion    Cancer Rehab [phone]    Hold tubing with left arm at side. Pull forward and up. Move shoulder through pain-free range of motion. Repeat _10___ times per set. Do _1-2___ sessions per day.  Strengthening: Resisted Internal Rotation    Hold tubing in left hand, elbow at side and forearm out. Rotate forearm in across body. Repeat _10___ times per set. Do _1-2___ sessions per day.  Strengthening: Resisted Extension    Hold tubing in left hand, arm forward. Pull arm back, elbow straight. Repeat __10__ times per set. Do __1-2__ sessions per day.   Strengthening: Resisted External Rotation    Hold tubing in left hand, elbow at side and forearm across body. Rotate forearm out. Repeat _10___ times per set. Do __1-2__ sessions per day.    **Repeat all on right **

## 2016-11-26 ENCOUNTER — Encounter (HOSPITAL_BASED_OUTPATIENT_CLINIC_OR_DEPARTMENT_OTHER): Payer: Self-pay | Admitting: *Deleted

## 2016-11-26 ENCOUNTER — Ambulatory Visit (HOSPITAL_BASED_OUTPATIENT_CLINIC_OR_DEPARTMENT_OTHER)
Admission: RE | Admit: 2016-11-26 | Discharge: 2016-11-26 | Disposition: A | Discharge status: Home / Self Care | Payer: BC Managed Care – PPO | Source: Ambulatory Visit | Attending: Obstetrics and Gynecology | Admitting: Obstetrics and Gynecology

## 2016-11-26 ENCOUNTER — Encounter (HOSPITAL_BASED_OUTPATIENT_CLINIC_OR_DEPARTMENT_OTHER)
Admission: RE | Disposition: A | Discharge status: Home / Self Care | Payer: Self-pay | Source: Ambulatory Visit | Attending: Obstetrics and Gynecology

## 2016-11-26 ENCOUNTER — Ambulatory Visit (HOSPITAL_BASED_OUTPATIENT_CLINIC_OR_DEPARTMENT_OTHER): Payer: BC Managed Care – PPO | Admitting: Anesthesiology

## 2016-11-26 DIAGNOSIS — Z803 Family history of malignant neoplasm of breast: Secondary | ICD-10-CM | POA: Insufficient documentation

## 2016-11-26 DIAGNOSIS — E7800 Pure hypercholesterolemia, unspecified: Secondary | ICD-10-CM | POA: Insufficient documentation

## 2016-11-26 DIAGNOSIS — N83201 Unspecified ovarian cyst, right side: Secondary | ICD-10-CM | POA: Insufficient documentation

## 2016-11-26 DIAGNOSIS — Z4002 Encounter for prophylactic removal of ovary: Secondary | ICD-10-CM | POA: Diagnosis not present

## 2016-11-26 DIAGNOSIS — N83202 Unspecified ovarian cyst, left side: Secondary | ICD-10-CM | POA: Insufficient documentation

## 2016-11-26 DIAGNOSIS — Z9013 Acquired absence of bilateral breasts and nipples: Secondary | ICD-10-CM | POA: Insufficient documentation

## 2016-11-26 DIAGNOSIS — Z885 Allergy status to narcotic agent status: Secondary | ICD-10-CM | POA: Insufficient documentation

## 2016-11-26 DIAGNOSIS — Z1501 Genetic susceptibility to malignant neoplasm of breast: Secondary | ICD-10-CM | POA: Insufficient documentation

## 2016-11-26 DIAGNOSIS — Z91040 Latex allergy status: Secondary | ICD-10-CM | POA: Insufficient documentation

## 2016-11-26 HISTORY — DX: Nausea with vomiting, unspecified: R11.2

## 2016-11-26 HISTORY — PX: LAPAROSCOPIC SALPINGO OOPHERECTOMY: SHX5927

## 2016-11-26 HISTORY — DX: Other specified postprocedural states: Z98.890

## 2016-11-26 LAB — POCT PREGNANCY, URINE: Preg Test, Ur: NEGATIVE

## 2016-11-26 SURGERY — SALPINGO-OOPHORECTOMY, LAPAROSCOPIC
Anesthesia: General | Site: Abdomen | Laterality: Bilateral

## 2016-11-26 MED ORDER — HYDROMORPHONE HCL-NACL 0.5-0.9 MG/ML-% IV SOSY
PREFILLED_SYRINGE | INTRAVENOUS | Status: AC
  Filled 2016-11-26: qty 1

## 2016-11-26 MED ORDER — PROPOFOL 10 MG/ML IV BOLUS
INTRAVENOUS | Status: DC | PRN
  Administered 2016-11-26: 200 mg via INTRAVENOUS

## 2016-11-26 MED ORDER — PROPOFOL 500 MG/50ML IV EMUL
INTRAVENOUS | Status: AC
  Filled 2016-11-26: qty 50

## 2016-11-26 MED ORDER — DEXAMETHASONE SODIUM PHOSPHATE 4 MG/ML IJ SOLN
INTRAMUSCULAR | Status: DC | PRN
  Administered 2016-11-26: 10 mg via INTRAVENOUS

## 2016-11-26 MED ORDER — OXYCODONE HCL 5 MG/5ML PO SOLN: 5.0000 mg | Freq: Once | ORAL | Status: AC | PRN

## 2016-11-26 MED ORDER — ONDANSETRON HCL 8 MG PO TABS: 8.0000 mg | ORAL_TABLET | Freq: Three times a day (TID) | ORAL | 0 refills | Status: AC | PRN

## 2016-11-26 MED ORDER — ROCURONIUM BROMIDE 100 MG/10ML IV SOLN
INTRAVENOUS | Status: DC | PRN
  Administered 2016-11-26: 50 mg via INTRAVENOUS

## 2016-11-26 MED ORDER — SODIUM CHLORIDE 0.9 % IV SOLN
INTRAVENOUS | Status: DC
  Administered 2016-11-26: 07:00:00 via INTRAVENOUS
  Filled 2016-11-26: qty 1000

## 2016-11-26 MED ORDER — FENTANYL CITRATE (PF) 100 MCG/2ML IJ SOLN
INTRAMUSCULAR | Status: DC | PRN
  Administered 2016-11-26: 100 ug via INTRAVENOUS

## 2016-11-26 MED ORDER — ACETAMINOPHEN-CODEINE #3 300-30 MG PO TABS: 1.0000 | ORAL_TABLET | ORAL | 0 refills | Status: DC | PRN

## 2016-11-26 MED ORDER — KETOROLAC TROMETHAMINE 30 MG/ML IJ SOLN
INTRAMUSCULAR | Status: DC | PRN
  Administered 2016-11-26: 30 mg via INTRAVENOUS

## 2016-11-26 MED ORDER — SODIUM CHLORIDE 0.9 % IV SOLN
INTRAVENOUS | Status: DC
  Administered 2016-11-26: 1000 mL via INTRAVENOUS
  Filled 2016-11-26: qty 1000

## 2016-11-26 MED ORDER — DEXTROSE 5 % IV SOLN
2.0000 g | INTRAVENOUS | Status: AC
  Administered 2016-11-26: 2 g via INTRAVENOUS
  Filled 2016-11-26: qty 2

## 2016-11-26 MED ORDER — ARTIFICIAL TEARS OPHTHALMIC OINT
TOPICAL_OINTMENT | OPHTHALMIC | Status: AC
  Filled 2016-11-26: qty 3.5

## 2016-11-26 MED ORDER — MIDAZOLAM HCL 2 MG/2ML IJ SOLN
INTRAMUSCULAR | Status: AC
  Filled 2016-11-26: qty 2

## 2016-11-26 MED ORDER — OXYCODONE HCL 5 MG PO TABS: 5.0000 mg | ORAL_TABLET | Freq: Once | ORAL | Status: AC | PRN

## 2016-11-26 MED ORDER — PROMETHAZINE HCL 25 MG/ML IJ SOLN
6.2500 mg | INTRAMUSCULAR | Status: DC | PRN
  Filled 2016-11-26: qty 1

## 2016-11-26 MED ORDER — MEPERIDINE HCL 25 MG/ML IJ SOLN
6.2500 mg | INTRAMUSCULAR | Status: DC | PRN
  Filled 2016-11-26: qty 1

## 2016-11-26 MED ORDER — KETOROLAC TROMETHAMINE 30 MG/ML IJ SOLN
INTRAMUSCULAR | Status: AC
  Filled 2016-11-26: qty 1

## 2016-11-26 MED ORDER — SODIUM CHLORIDE 0.9 % IR SOLN
Status: DC | PRN
  Administered 2016-11-26: 500 mL

## 2016-11-26 MED ORDER — ONDANSETRON HCL 4 MG/2ML IJ SOLN
INTRAMUSCULAR | Status: AC
  Filled 2016-11-26: qty 2

## 2016-11-26 MED ORDER — KETOROLAC TROMETHAMINE 30 MG/ML IJ SOLN
30.0000 mg | Freq: Once | INTRAMUSCULAR | Status: DC | PRN
  Filled 2016-11-26: qty 1

## 2016-11-26 MED ORDER — FENTANYL CITRATE (PF) 100 MCG/2ML IJ SOLN
INTRAMUSCULAR | Status: AC
  Filled 2016-11-26: qty 2

## 2016-11-26 MED ORDER — DEXAMETHASONE SODIUM PHOSPHATE 10 MG/ML IJ SOLN
INTRAMUSCULAR | Status: AC
  Filled 2016-11-26: qty 1

## 2016-11-26 MED ORDER — ONDANSETRON HCL 4 MG/2ML IJ SOLN
INTRAMUSCULAR | Status: DC | PRN
  Administered 2016-11-26: 4 mg via INTRAVENOUS

## 2016-11-26 MED ORDER — MIDAZOLAM HCL 5 MG/5ML IJ SOLN
INTRAMUSCULAR | Status: DC | PRN
  Administered 2016-11-26: 2 mg via INTRAVENOUS

## 2016-11-26 MED ORDER — CEFOTETAN DISODIUM-DEXTROSE 2-2.08 GM-% IV SOLR
INTRAVENOUS | Status: AC
  Filled 2016-11-26: qty 50

## 2016-11-26 MED ORDER — LIDOCAINE 2% (20 MG/ML) 5 ML SYRINGE
INTRAMUSCULAR | Status: AC
  Filled 2016-11-26: qty 5

## 2016-11-26 MED ORDER — HYDROMORPHONE HCL 1 MG/ML IJ SOLN
0.2500 mg | INTRAMUSCULAR | Status: DC | PRN
  Administered 2016-11-26: 0.25 mg via INTRAVENOUS
  Filled 2016-11-26: qty 0.5

## 2016-11-26 MED ORDER — SUGAMMADEX SODIUM 200 MG/2ML IV SOLN
INTRAVENOUS | Status: DC | PRN
  Administered 2016-11-26: 200 mg via INTRAVENOUS

## 2016-11-26 MED ORDER — SCOPOLAMINE 1 MG/3DAYS TD PT72
MEDICATED_PATCH | TRANSDERMAL | Status: AC
Start: 2016-11-26 — End: ?
  Filled 2016-11-26: qty 1

## 2016-11-26 MED ORDER — ROCURONIUM BROMIDE 50 MG/5ML IV SOSY
PREFILLED_SYRINGE | INTRAVENOUS | Status: AC
  Filled 2016-11-26: qty 5

## 2016-11-26 MED ORDER — SUGAMMADEX SODIUM 200 MG/2ML IV SOLN
INTRAVENOUS | Status: AC
  Filled 2016-11-26: qty 2

## 2016-11-26 MED ORDER — SCOPOLAMINE 1 MG/3DAYS TD PT72
MEDICATED_PATCH | TRANSDERMAL | Status: DC | PRN
  Administered 2016-11-26: 1 via TRANSDERMAL

## 2016-11-26 SURGICAL SUPPLY — 54 items
APPLICATOR COTTON TIP 6IN STRL (MISCELLANEOUS) ×2 IMPLANT
APPLIER CLIP LOGIC TI 5 (MISCELLANEOUS) IMPLANT
BAG RETRIEVAL 10 (BASKET)
BANDAGE STRIP 1X3 FLEXIBLE (GAUZE/BANDAGES/DRESSINGS) IMPLANT
BENZOIN TINCTURE PRP APPL 2/3 (GAUZE/BANDAGES/DRESSINGS) IMPLANT
BLADE CLIPPER SURG (BLADE) ×2 IMPLANT
BLADE SURG 11 STRL SS (BLADE) ×2 IMPLANT
CANISTER SUCT 3000ML PPV (MISCELLANEOUS) ×2 IMPLANT
CANISTER SUCTION 1200CC (MISCELLANEOUS) IMPLANT
CATH FOLEY 2WAY  5CC 16FR SIL (CATHETERS) ×1
CATH FOLEY 2WAY 5CC 16FR SIL (CATHETERS) ×1 IMPLANT
CATH ROBINSON RED A/P 16FR (CATHETERS) IMPLANT
COVER MAYO STAND STRL (DRAPES) ×2 IMPLANT
DERMABOND ADVANCED (GAUZE/BANDAGES/DRESSINGS) ×1
DERMABOND ADVANCED .7 DNX12 (GAUZE/BANDAGES/DRESSINGS) ×1 IMPLANT
DRSG OPSITE POSTOP 3X4 (GAUZE/BANDAGES/DRESSINGS) ×2 IMPLANT
DRSG TELFA 3X8 NADH (GAUZE/BANDAGES/DRESSINGS) IMPLANT
DURAPREP 26ML APPLICATOR (WOUND CARE) ×2 IMPLANT
ELECT REM PT RETURN 9FT ADLT (ELECTROSURGICAL) ×2
ELECTRODE REM PT RTRN 9FT ADLT (ELECTROSURGICAL) ×1 IMPLANT
GLOVE BIO SURGEON STRL SZ 6.5 (GLOVE) IMPLANT
GLOVE BIOGEL M 6.5 STRL (GLOVE) ×6 IMPLANT
GOWN STRL REUS W/ TWL LRG LVL3 (GOWN DISPOSABLE) IMPLANT
GOWN STRL REUS W/TWL LRG LVL3 (GOWN DISPOSABLE)
KIT RM TURNOVER CYSTO AR (KITS) ×2 IMPLANT
LEGGING LITHOTOMY PAIR STRL (DRAPES) IMPLANT
NEEDLE HYPO 25X1 1.5 SAFETY (NEEDLE) ×2 IMPLANT
NEEDLE INSUFFLATION 14GA 120MM (NEEDLE) ×2 IMPLANT
NEEDLE INSUFFLATION 14GA 150MM (NEEDLE) IMPLANT
NS IRRIG 500ML POUR BTL (IV SOLUTION) IMPLANT
PACK LAVH (CUSTOM PROCEDURE TRAY) ×2 IMPLANT
PAD OB MATERNITY 4.3X12.25 (PERSONAL CARE ITEMS) ×2 IMPLANT
PAD PREP 24X48 CUFFED NSTRL (MISCELLANEOUS) ×2 IMPLANT
SCISSORS LAP 5X35 DISP (ENDOMECHANICALS) IMPLANT
SEALER TISSUE G2 CVD JAW 35 (ENDOMECHANICALS) IMPLANT
SEALER TISSUE G2 CVD JAW 45CM (ENDOMECHANICALS) ×2 IMPLANT
SET IRRIG TUBING LAPAROSCOPIC (IRRIGATION / IRRIGATOR) IMPLANT
STRIP CLOSURE SKIN 1/2X4 (GAUZE/BANDAGES/DRESSINGS) IMPLANT
SUT VIC AB 3-0 PS2 18 (SUTURE) ×1
SUT VIC AB 3-0 PS2 18XBRD (SUTURE) ×1 IMPLANT
SUT VICRYL 0 UR6 27IN ABS (SUTURE) ×2 IMPLANT
SYR 3ML 23GX1 SAFETY (SYRINGE) IMPLANT
SYRINGE 10CC LL (SYRINGE) IMPLANT
SYS BAG RETRIEVAL 10MM (BASKET)
SYSTEM BAG RETRIEVAL 10MM (BASKET) IMPLANT
TOWEL OR 17X24 6PK STRL BLUE (TOWEL DISPOSABLE) ×4 IMPLANT
TRAY FOLEY BAG SILVER LF 16FR (CATHETERS) IMPLANT
TROCAR OPTI TIP 5M 100M (ENDOMECHANICALS) ×2 IMPLANT
TROCAR XCEL DIL TIP R 11M (ENDOMECHANICALS) ×2 IMPLANT
TROCAR XCEL NON-BLD 11X100MML (ENDOMECHANICALS) ×2 IMPLANT
TUBE CONNECTING 12X1/4 (SUCTIONS) ×2 IMPLANT
TUBING INSUF HEATED (TUBING) ×2 IMPLANT
WARMER LAPAROSCOPE (MISCELLANEOUS) ×2 IMPLANT
WATER STERILE IRR 500ML POUR (IV SOLUTION) ×2 IMPLANT

## 2016-11-26 NOTE — Discharge Instructions (Signed)
[facility] office 2-3 weeks for postop appointment.  Call the office [phone] for an appointment.  Personal Hygiene: Use pads not tampons x 1week You may shower, no tub baths or pools for 2-3 weeks Wipe from front to back when using restroom  Activity: Do not drive or operate any equipment for 24 hrs.   Do not rest in bed all day Walking is encouraged Walk up and down stairs slowly You may return to your normal activity in 1-2 weeks No soaking in tubs pool for 2 weeks  Sexual Activity:  No intercourse for 2 weeks after the procedure.  Diet: Eat a light meal as desired this evening.  You may resume your usual diet tomorrow.  Return to work:  You may resume your work activities after 1-2 weeks  What to expect:  Expect to have vaginal bleeding/discharge for 2-3 weeks and spotting for 10-14 days.  It is not unusual to have soreness for 1-2 weeks.  You may have a slight burning sensation when you urinate for the first few days.  Mild cramps may continue for a couple of days.    Call your doctor:   Excessive bleeding, saturating a pad every hour Inability to urinate 6 hours after discharge Pain not relieved with pain medications Fever of 100.4 or greater   DISCHARGE INSTRUCTIONS: Laparoscopy  The following instructions have been prepared to help you care for yourself upon your return home today.  Wound care:  Do not get the incision wet for the first 24 hours. The incision should be kept clean and dry.  The Band-Aids or dressings may be removed the day after surgery.  Should the incision become sore, red, and swollen after the first week, check with your doctor.  Personal hygiene:  Shower the day after your procedure.  Activity and limitations:  Do NOT drive or operate any equipment today.  Do NOT lift anything more than 15 pounds for 2-3 weeks after surgery.  Do NOT rest in bed all day.  Walking is encouraged. Walk each day, starting slowly with 5-minute walks 3 or 4 times a  day. Slowly increase the length of your walks.  Walk up and down stairs slowly.  Do NOT do strenuous activities, such as golfing, playing tennis, bowling, running, biking, weight lifting, gardening, mowing, or vacuuming for 2-4 weeks. Ask your doctor when it is okay to start.  Diet: Eat a light meal as desired this evening. You may resume your usual diet tomorrow.  Return to work: This is dependent on the type of work you do. For the most part you can return to a desk job within a week of surgery. If you are more active at work, please discuss this with your doctor.  What to expect after your surgery: You may have a slight burning sensation when you urinate on the first day. You may have a very small amount of blood in the urine. Expect to have a small amount of vaginal discharge/light bleeding for 1-2 weeks. It is not unusual to have abdominal soreness and bruising for up to 2 weeks. You may be tired and need more rest for about 1 week. You may experience shoulder pain for 24-72 hours. Lying flat in bed may relieve it.  Call your doctor for any of the following:  Develop a fever of 100.4 or greater  Inability to urinate 6 hours after discharge from hospital  Severe pain not relieved by pain medications  Persistent of heavy bleeding at incision site  Redness or swelling around incision site after a week  Increasing nausea or vomiting  Patient Signature________________________________________ Nurse Signature_________________________________________ Post Anesthesia Home Care Instructions  Activity: Get plenty of rest for the remainder of the day. A responsible individual must stay with you for 24 hours following the procedure.  For the next 24 hours, DO NOT: -Drive a car -Paediatric nurse -Drink alcoholic beverages -Take any medication unless instructed by your physician -Make any legal decisions or sign important papers.  Meals: Start with liquid foods such as gelatin or soup.  Progress to regular foods as tolerated. Avoid greasy, spicy, heavy foods. If nausea and/or vomiting occur, drink only clear liquids until the nausea and/or vomiting subsides. Call your physician if vomiting continues.  Special Instructions/Symptoms: Your throat may feel dry or sore from the anesthesia or the breathing tube placed in your throat during surgery. If this causes discomfort, gargle with warm salt water. The discomfort should disappear within 24 hours.  If you had a scopolamine patch placed behind your ear for the management of post- operative nausea and/or vomiting:  1. The medication in the patch is effective for 72 hours, after which it should be removed.  Wrap patch in a tissue and discard in the trash. Wash hands thoroughly with soap and water. 2. You may remove the patch earlier than 72 hours if you experience unpleasant side effects which may include dry mouth, dizziness or visual disturbances. 3. Avoid touching the patch. Wash your hands with soap and water after contact with the patch.

## 2016-11-26 NOTE — H&P (Signed)
Anne Fuller is an 41 y.o. female presents for surgical risk reducing BSO.  Pt BRCA carrier.      Menstrual History: Patient's last menstrual period was 10/30/2016 (approximate).    Past Medical History:  Diagnosis Date  . 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 Complication of anesthesia    difficult to wake up  . High cholesterol   . PONV (postoperative nausea and vomiting)     Past Surgical History:  Procedure Laterality Date  . MASTECTOMY Bilateral    with reconstruction  . NIPPLE SPARING MASTECTOMY Bilateral 10/07/2016   Procedure: NIPPLE SPARING MASTECTOMY;  Surgeon: Stark Klein, MD;  Location: [place];  Service: General;  Laterality: Bilateral;  . WISDOM TOOTH EXTRACTION      Family History  Problem Relation Age of Onset  . Cancer Mother   . High Cholesterol Mother   . Breast cancer Mother   . Cancer Cousin   . Breast cancer Cousin   . Diabetes Father   . High blood pressure Father   . Heart Problems Father   . Cancer Maternal Grandfather   . Mental illness Paternal Grandmother     Social History:  reports that she has never smoked. She has never used smokeless tobacco. She reports that she drinks about 0.6 oz of alcohol per week . She reports that she does not use drugs.  Allergies:  Allergies  Allergen Reactions  . Hydrocodone Other (See Comments)    Up set stomach  . Latex Itching  . Oxycodone Nausea And Vomiting    (per pt can tolerate tylenol #3)    Prescriptions Prior to Admission  Medication Sig Dispense Refill Last Dose  . acetaminophen-codeine (TYLENOL #3) 300-30 MG tablet Take 1-2 tablets by mouth every 4 (four) hours as needed for moderate pain. 30 tablet 0 several weeks ago  . fexofenadine (ALLEGRA) 30 MG tablet Take 30 mg by mouth 2 (two) times daily as needed.    Past Week at Unknown time  . ibuprofen (ADVIL,MOTRIN) 200 MG tablet Take 200 mg by mouth every 6 (six) hours as needed.   3 weeks  . Multiple Vitamin (MULTIVITAMIN  WITH MINERALS) TABS tablet Take 1 tablet by mouth daily.   2 months at Unknown time  . Omega-3 Fatty Acids (FISH OIL PO) Take 1 capsule by mouth daily.   2 months at Unknown time  . prochlorperazine (COMPAZINE) 10 MG tablet Take 1 tablet (10 mg total) by mouth every 6 (six) hours as needed for nausea or vomiting (Use for nausea and / or vomiting unresolved with ondansetron (Zofran).). 20 tablet 0 has not taken    ROS  Blood pressure 120/67, pulse 68, temperature 98.4 F (36.9 C), temperature source Oral, resp. rate 16, height 5' 7.5" (1.715 m), weight 187 lb (84.8 kg), last menstrual period 10/30/2016, SpO2 100 %. Physical Exam  Gen - NAD CV - RRR Lungs - clear Abd - soft, NT PV - uterus mobile, NT.  No adnexal masses  Results for orders placed or performed during the hospital encounter of 11/26/16 (from the past 48 hour(s))  Type and screen [facility]     Status: None   Collection Time: 11/24/16 12:33 PM  Result Value Ref Range   ABO/RH(D) A POS    Antibody Screen NEG    Sample Expiration 11/27/2016   ABO/Rh     Status: None   Collection Time: 11/24/16 12:33 PM  Result Value Ref Range   ABO/RH(D) A  POS   CBC     Status: Abnormal   Collection Time: 11/24/16 12:42 PM  Result Value Ref Range   WBC 6.1 4.0 - 10.5 K/uL   RBC 4.43 3.87 - 5.11 MIL/uL   Hemoglobin 11.4 (L) 12.0 - 15.0 g/dL   HCT 34.7 (L) 36.0 - 46.0 %   MCV 78.3 78.0 - 100.0 fL   MCH 25.7 (L) 26.0 - 34.0 pg   MCHC 32.9 30.0 - 36.0 g/dL   RDW 13.0 11.5 - 15.5 %   Platelets 366 150 - 400 K/uL   Assessment/Plan:  BRCA carrier Laparoscopic BSO R/b/a discussed, questions answered, informed consent  Anne Fuller 11/26/2016, 7:14 AM

## 2016-11-26 NOTE — Anesthesia Procedure Notes (Signed)
Procedure Name: Intubation Date/Time: 11/26/2016 7:35 AM Performed by: Wanita Chamberlain Pre-anesthesia Checklist: Patient being monitored, Suction available, Emergency Drugs available, Patient identified and Timeout performed Patient Re-evaluated:Patient Re-evaluated prior to induction Oxygen Delivery Method: Circle system utilized Preoxygenation: Pre-oxygenation with 100% oxygen Induction Type: IV induction Ventilation: Mask ventilation without difficulty Laryngoscope Size: Mac and 3 Grade View: Grade I Tube type: Oral Tube size: 7.0 mm Number of attempts: 1 Airway Equipment and Method: Stylet Placement Confirmation: breath sounds checked- equal and bilateral,  positive ETCO2 and ETT inserted through vocal cords under direct vision Secured at: 22 cm Tube secured with: Tape Dental Injury: Teeth and Oropharynx as per pre-operative assessment

## 2016-11-26 NOTE — Anesthesia Preprocedure Evaluation (Addendum)
Anesthesia Evaluation  Patient identified by MRN, date of birth, ID band Patient awake    Reviewed: Allergy & Precautions, NPO status , Patient's Chart, lab work & pertinent test results  History of Anesthesia Complications (+) PONV and history of anesthetic complications  Airway Mallampati: II  TM Distance: >3 FB Neck ROM: Full    Dental no notable dental hx. (+) Teeth Intact, Dental Advisory Given   Pulmonary neg pulmonary ROS,    Pulmonary exam normal breath sounds clear to auscultation       Cardiovascular negative cardio ROS Normal cardiovascular exam Rhythm:Regular Rate:Normal     Neuro/Psych  Headaches, negative neurological ROS  negative psych ROS   GI/Hepatic negative GI ROS, Neg liver ROS,   Endo/Other  negative endocrine ROS  Renal/GU negative Renal ROS     Musculoskeletal negative musculoskeletal ROS (+)   Abdominal   Peds  Hematology negative hematology ROS (+)   Anesthesia Other Findings   Reproductive/Obstetrics negative OB ROS                            Anesthesia Physical  Anesthesia Plan  ASA: II  Anesthesia Plan: General   Post-op Pain Management:    Induction: Intravenous  PONV Risk Score and Plan: 4 or greater and Ondansetron, Dexamethasone, Propofol, Midazolam and Scopolamine patch - Pre-op  Airway Management Planned: Oral ETT  Additional Equipment:   Intra-op Plan:   Post-operative Plan: Extubation in OR  Informed Consent: I have reviewed the patients History and Physical, chart, labs and discussed the procedure including the risks, benefits and alternatives for the proposed anesthesia with the patient or authorized representative who has indicated his/her understanding and acceptance.   Dental advisory given  Plan Discussed with: CRNA  Anesthesia Plan Comments:         Anesthesia Quick Evaluation

## 2016-11-26 NOTE — Anesthesia Postprocedure Evaluation (Signed)
Anesthesia Post Note  Patient: Anne Fuller  Procedure(s) Performed: Procedure(s) (LRB): LAPAROSCOPIC BILATERAL SALPINGO OOPHORECTOMY (Bilateral)     Patient location during evaluation: PACU Anesthesia Type: General Level of consciousness: sedated and patient cooperative Pain management: pain level controlled Vital Signs Assessment: post-procedure vital signs reviewed and stable Respiratory status: spontaneous breathing Cardiovascular status: stable Anesthetic complications: no    Last Vitals:  Vitals:   11/26/16 1044 11/26/16 1119  BP:  139/73  Pulse: 67 75  Resp: 16 12  Temp:    SpO2: 100% 96%    Last Pain:  Vitals:   11/26/16 1030  TempSrc:   PainSc: 2                  Nolon Nations

## 2016-11-26 NOTE — Transfer of Care (Signed)
Immediate Anesthesia Transfer of Care Note  Patient: Anne Fuller  Procedure(s) Performed: Procedure(s): LAPAROSCOPIC BILATERAL SALPINGO OOPHORECTOMY (Bilateral)  Patient Location: PACU  Anesthesia Type:General  Level of Consciousness: awake, alert , oriented and patient cooperative  Airway & Oxygen Therapy: Patient Spontanous Breathing and Patient connected to nasal cannula oxygen  Post-op Assessment: Report given to RN and Post -op Vital signs reviewed and stable  Post vital signs: Reviewed and stable  Last Vitals:  Vitals:   11/26/16 0559  BP: 120/67  Pulse: 68  Resp: 16  Temp: 36.9 C  SpO2: 100%    Last Pain:  Vitals:   11/26/16 0559  TempSrc: Oral      Patients Stated Pain Goal: 7 (62/83/66 2947)  Complications: No apparent anesthesia complications

## 2016-11-27 ENCOUNTER — Encounter (HOSPITAL_BASED_OUTPATIENT_CLINIC_OR_DEPARTMENT_OTHER): Payer: Self-pay | Admitting: Obstetrics and Gynecology

## 2016-12-09 ENCOUNTER — Ambulatory Visit: Payer: BC Managed Care – PPO | Attending: Plastic Surgery

## 2016-12-09 DIAGNOSIS — M25512 Pain in left shoulder: Secondary | ICD-10-CM | POA: Diagnosis present

## 2016-12-09 DIAGNOSIS — M25611 Stiffness of right shoulder, not elsewhere classified: Secondary | ICD-10-CM | POA: Diagnosis not present

## 2016-12-09 DIAGNOSIS — M25612 Stiffness of left shoulder, not elsewhere classified: Secondary | ICD-10-CM | POA: Diagnosis present

## 2016-12-09 DIAGNOSIS — M25511 Pain in right shoulder: Secondary | ICD-10-CM | POA: Insufficient documentation

## 2016-12-09 NOTE — Therapy (Signed)
[place], [place], [address] Phone: [phone]   Fax:  [phone]  Physical Therapy Treatment  Patient Details  Name: Anne Fuller MRN: [id number] Date of Birth: [birth year] Referring Provider: Dr. Irene Limbo  Encounter Date: 12/09/2016      PT End of Session - 12/09/16 0849    Visit Number 7   Number of Visits 9   Date for PT Re-Evaluation 12/19/16   PT Start Time 0811  Pt arrived late   PT Stop Time 0849   PT Time Calculation (min) 38 min   Activity Tolerance Patient tolerated treatment well   Behavior During Therapy [facility] for tasks assessed/performed      Past Medical History:  Diagnosis Date  . 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 Complication of anesthesia    difficult to wake up  . High cholesterol   . PONV (postoperative nausea and vomiting)     Past Surgical History:  Procedure Laterality Date  . LAPAROSCOPIC SALPINGO OOPHERECTOMY Bilateral 11/26/2016   Procedure: LAPAROSCOPIC BILATERAL SALPINGO OOPHORECTOMY;  Surgeon: Marylynn Pearson, MD;  Location: [place];  Service: Gynecology;  Laterality: Bilateral;  . MASTECTOMY Bilateral    with reconstruction  . NIPPLE SPARING MASTECTOMY Bilateral 10/07/2016   Procedure: NIPPLE SPARING MASTECTOMY;  Surgeon: Stark Klein, MD;  Location: [place];  Service: General;  Laterality: Bilateral;  . WISDOM TOOTH EXTRACTION      There were no vitals filed for this visit.      Subjective Assessment - 12/09/16 0812    Subjective The tightness has improved overall in my shoulders, the Lt is still better but it's not as much of a differenceas it used to be. My surgery went well, had no problems with that. I stopped my strengthening exercises for a bit during my recovery but have been trying to stretch. Just have some tightness this morning at my bil flank areas, no pain.   Pertinent History BRCA 1 positive with  bilateral prophylactic mastectomy on 10/07/16 with no lymph nodes removed.  High cholesterol.  Has had PT in the past and chiropractic for pinched nerve in back; h/o hip bursitis and gets that pain shwen she walks a lot but that doesn't bother her when she weights less.   Patient Stated Goals try to get more flexibility and ROM with hers arms   Currently in Pain? No/denies            [facility] PT Assessment - 12/09/16 0001      AROM   Right Shoulder Flexion 161 Degrees   Right Shoulder ABduction 164 Degrees   Right Shoulder Internal Rotation 84 Degrees   Right Shoulder External Rotation 90 Degrees   Left Shoulder Flexion 164 Degrees   Left Shoulder ABduction 175 Degrees   Left Shoulder External Rotation 90 Degrees                     [facility] Adult PT Treatment/Exercise - 12/09/16 0001      Shoulder Exercises: Pulleys   Flexion 2 minutes   ABduction 2 minutes   ABduction Limitations VC's to relax shoulders     Shoulder Exercises: Therapy Ball   Flexion 10 reps  With forward lean into end of stretch   ABduction 5 reps  Rt UE with side lean into end of stretch     Manual Therapy   Myofascial Release left, then right UE myofascial pulling with movement into abduction; crosshands  with one hand at right upper arm and concurrent distraction at right flank   Passive ROM to right shoulder with stretch to tolerance into D2, abduction, and flexion, then same on left;                         [facility] Goals - 12/09/16 0854      CC Long Term Goal  #1   Title Pt. will show at least 150 degrees of active shoulder flexion bilaterally for improved overhead reach   Baseline R 144 degrees, L 156 degrees; Rt 161degrees-12/09/16   Status Achieved     CC Long Term Goal  #2   Title Pt. will show at least 160 degrees of active shoulder abduction bilaterally for improved ADLs   Baseline R 145 degrees, L 174 degrees; Rt 164 degrees-12/09/16   Status Achieved      CC Long Term Goal  #3   Title Pt. will show at least 85 degrees shoulder er bilaterally for improved ADLs   Baseline Bil 90 degrees-12/09/16   Status Achieved     CC Long Term Goal  #4   Title Independent in HEP for shoulder ROM and strengthening   Status On-going     CC Long Term Goal  #5   Title Will report at least 50% decrease in discomfort with reaching with each arm.   Status On-going            Plan - 12/09/16 0849    Clinical Impression Statement Pt has made excellent gains with her A/ROM since she was last here meeting her ROM goals. She reports decreased frequency of HEP during recovery of her oophorectomy but has started streching again recently. She starts back to work Thursday.   Rehab Potential Excellent   Clinical Impairments Affecting Rehab Potential none   PT Frequency 2x / week   PT Duration 4 weeks   PT Treatment/Interventions ADLs/Self Care Home Management;Therapeutic exercise;Patient/family education;Manual techniques;Manual lymph drainage;Scar mobilization;Passive range of motion   PT Next Visit Plan Review, ADD to HEP as appropriate, especially given that patient will be coming less frequently to PT after next week between having abdominal surgery and then returning to work.  Continue ROM to both shoulders with focus on right.   Consulted and Agree with Plan of Care Patient      Patient will benefit from skilled therapeutic intervention in order to improve the following deficits and impairments:  Decreased range of motion, Impaired UE functional use, Pain, Decreased scar mobility, Increased edema  Visit Diagnosis: Stiffness of right shoulder, not elsewhere classified  Stiffness of left shoulder, not elsewhere classified  Right shoulder pain, unspecified chronicity  Left shoulder pain, unspecified chronicity     Problem List Patient Active Problem List   Diagnosis Date Noted  . BRCA gene mutation positive 10/07/2016  . Headache(784.0) 03/09/2013   . 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

## 2016-12-09 NOTE — Op Note (Signed)
NAME:  Anne Fuller, Anne Fuller            ACCOUNT NO.:  MEDICAL RECORD NO.:  [id number]  LOCATION:                                 FACILITY:  PHYSICIAN:  Marylynn Pearson, MD         DATE OF BIRTH:  DATE OF PROCEDURE:  11/26/2016 DATE OF DISCHARGE:                              OPERATIVE REPORT   PREOPERATIVE DIAGNOSIS:  BRCA carrier.  POSTOPERATIVE DIAGNOSIS:  BRCA carrier.  PROCEDURE:  Risk reducing bilateral salpingo-oophorectomy.  SURGEON:  Marylynn Pearson, MD.  ANESTHESIA:  General.  SPECIMEN:  Bilateral tubes and ovaries.  COMPLICATIONS:  None.  CONDITION:  Stable to recovery room.  DESCRIPTION OF PROCEDURE:  The patient was taken to the operating room after informed consent was obtained.  She was given general anesthesia, placed in the dorsal lithotomy position using Allen stirrups.  She was prepped and draped sterilely.  An in-and-out catheter was used to drain her bladder.  Bivalve speculum was placed in the vagina and a single- tooth tenaculum was attached to the anterior lip of the cervix.  Hulka clamp was placed.  Tenaculum and speculum were removed and our attention was turned to the abdomen.  0.25% Marcaine was used to provide local anesthesia to the site of our infraumbilical and suprapubic incision. An infraumbilical skin incision was made with a scalpel and extended to the level of the fascia bluntly using a Kelly clamp.  Optical trocar was inserted under direct visualization.  Once intraperitoneal placement was confirmed, CO2 was turned on and a survey was performed.  Bilateral tubes and ovaries and uterus appeared normal.  A 5 mm suprapubic trocar was inserted under direct visualization.  An atraumatic grasper was used to grasp the right adnexa.  It was tented upwards and toward the midline.  EnSeal was used to clamp, cauterize and cut the right infundibulopelvic ligament.  This was extended along the broad ligament, staying just adjacent to the tube and  ovary.  The utero-ovarian and fallopian tube were then clamped, cauterized, and cut using the EnSeal. The specimen was placed in a bag and removed.  This procedure was then repeated on the left adnexa.  Excellent hemostasis was then noted bilaterally.  All instruments and trocars were then removed from the abdomen.  A deep stitch was placed in the infraumbilical incision and the skin was closed with Vicryl.  The suprapubic incision was closed with Vicryl.  Skin glue and bandages were placed.  The Hulka clamp was removed.  She was extubated and taken to the recovery room in stable condition.     Marylynn Pearson, MD     GA/MEDQ  D:  12/08/2016  T:  12/09/2016  Job:  [id number]

## 2016-12-11 ENCOUNTER — Ambulatory Visit: Payer: BC Managed Care – PPO

## 2016-12-11 DIAGNOSIS — M25611 Stiffness of right shoulder, not elsewhere classified: Secondary | ICD-10-CM

## 2016-12-11 DIAGNOSIS — M25511 Pain in right shoulder: Secondary | ICD-10-CM

## 2016-12-11 DIAGNOSIS — M25512 Pain in left shoulder: Secondary | ICD-10-CM

## 2016-12-11 DIAGNOSIS — M25612 Stiffness of left shoulder, not elsewhere classified: Secondary | ICD-10-CM

## 2016-12-11 NOTE — Therapy (Signed)
[place], [place], [address] Phone: [phone]   Fax:  [phone]  Physical Therapy Treatment  Patient Details  Name: Anne Fuller MRN: [id number] Date of Birth: 1975/08/10 Referring Provider: Dr. Irene Limbo  Encounter Date: 12/11/2016      PT End of Session - 12/11/16 0842    Visit Number 8   Number of Visits 9   Date for PT Re-Evaluation 12/19/16   PT Start Time 0812  Pt arrived late   PT Stop Time 0845   PT Time Calculation (min) 33 min   Activity Tolerance Patient tolerated treatment well   Behavior During Therapy [facility] for tasks assessed/performed      Past Medical History:  Diagnosis Date  . 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 Complication of anesthesia    difficult to wake up  . High cholesterol   . PONV (postoperative nausea and vomiting)     Past Surgical History:  Procedure Laterality Date  . LAPAROSCOPIC SALPINGO OOPHERECTOMY Bilateral 11/26/2016   Procedure: LAPAROSCOPIC BILATERAL SALPINGO OOPHORECTOMY;  Surgeon: Marylynn Pearson, MD;  Location: [place];  Service: Gynecology;  Laterality: Bilateral;  . MASTECTOMY Bilateral    with reconstruction  . NIPPLE SPARING MASTECTOMY Bilateral 10/07/2016   Procedure: NIPPLE SPARING MASTECTOMY;  Surgeon: Stark Klein, MD;  Location: [place];  Service: General;  Laterality: Bilateral;  . WISDOM TOOTH EXTRACTION      There were no vitals filed for this visit.      Subjective Assessment - 12/11/16 0815    Subjective Felt fine after last session. Nothing new except go back to work today.    Pertinent History BRCA 1 positive with bilateral prophylactic mastectomy on 10/07/16 with no lymph nodes removed.  High cholesterol.  Has had PT in the past and chiropractic for pinched nerve in back; h/o hip bursitis and gets that pain shwen she walks a lot but that doesn't bother her when she weights less.   Patient Stated Goals try to get more flexibility and ROM with hers arms   Currently in Pain? No/denies                         [facility] Adult PT Treatment/Exercise - 12/11/16 0001      Shoulder Exercises: Pulleys   Flexion 2 minutes   ABduction 2 minutes     Shoulder Exercises: Therapy Ball   Flexion 10 reps  With forward lean into end of stretch     Manual Therapy   Myofascial Release left, then right UE myofascial pulling with movement into abduction; crosshands with one hand at right upper arm and concurrent distraction at right flank   Passive ROM to right shoulder with stretch to tolerance into D2, abduction, and flexion, then same on left;                         [facility] Goals - 12/09/16 0854      CC Long Term Goal  #1   Title Pt. will show at least 150 degrees of active shoulder flexion bilaterally for improved overhead reach   Baseline R 144 degrees, L 156 degrees; Rt 161degrees-12/09/16   Status Achieved     CC Long Term Goal  #2   Title Pt. will show at least 160 degrees of active shoulder abduction bilaterally for improved ADLs   Baseline R 145 degrees, L 174  degrees; Rt 164 degrees-12/09/16   Status Achieved     CC Long Term Goal  #3   Title Pt. will show at least 85 degrees shoulder er bilaterally for improved ADLs   Baseline Bil 90 degrees-12/09/16   Status Achieved     CC Long Term Goal  #4   Title Independent in HEP for shoulder ROM and strengthening   Status On-going     CC Long Term Goal  #5   Title Will report at least 50% decrease in discomfort with reaching with each arm.   Status On-going            Plan - 12/11/16 5520    Clinical Impression Statement Pt arrived late again today. She goes back to work today and reports feels ready for this. She tolerated AA/ROM stretching well; with manual stretching her end ROM does seem to be improving but she is still tight and tender at Rt axilla where cording is  visible at end of motion.    Rehab Potential Excellent   Clinical Impairments Affecting Rehab Potential none   PT Frequency 2x / week   PT Duration 4 weeks   PT Treatment/Interventions ADLs/Self Care Home Management;Therapeutic exercise;Patient/family education;Manual techniques;Manual lymph drainage;Scar mobilization;Passive range of motion   PT Next Visit Plan Review, Add to HEP as appropriate and issue red theraband for supine scap if pt ready, especially given that patient will be coming less frequently to PT after next week between having abdominal surgery and then returning to work.  Continue ROM to both shoulders with focus on right.   Consulted and Agree with Plan of Care Patient      Patient will benefit from skilled therapeutic intervention in order to improve the following deficits and impairments:  Decreased range of motion, Impaired UE functional use, Pain, Decreased scar mobility, Increased edema  Visit Diagnosis: Stiffness of right shoulder, not elsewhere classified  Stiffness of left shoulder, not elsewhere classified  Right shoulder pain, unspecified chronicity  Left shoulder pain, unspecified chronicity     Problem List Patient Active Problem List   Diagnosis Date Noted  . BRCA gene mutation positive 10/07/2016  . Headache(784.0) 03/09/2013  . 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

## 2016-12-15 ENCOUNTER — Ambulatory Visit: Payer: BC Managed Care – PPO | Admitting: Physical Therapy

## 2016-12-15 ENCOUNTER — Encounter: Payer: Self-pay | Admitting: Physical Therapy

## 2016-12-15 DIAGNOSIS — M25612 Stiffness of left shoulder, not elsewhere classified: Secondary | ICD-10-CM

## 2016-12-15 DIAGNOSIS — M25512 Pain in left shoulder: Secondary | ICD-10-CM

## 2016-12-15 DIAGNOSIS — M25611 Stiffness of right shoulder, not elsewhere classified: Secondary | ICD-10-CM

## 2016-12-15 DIAGNOSIS — M25511 Pain in right shoulder: Secondary | ICD-10-CM

## 2016-12-15 NOTE — Therapy (Signed)
[facility] [address] [place], [place], [address] Phone: [phone]   Fax:  [phone]  Physical Therapy Treatment  Patient Details  Name: Anne Fuller MRN: [id number] Date of Birth: 1975-12-28 Referring Provider: Dr. Glenna Fellows  Encounter Date: 12/15/2016      PT End of Session - 12/15/16 0831    Visit Number 9   Number of Visits 9   Date for PT Re-Evaluation 12/19/16   PT Start Time 0809  pt arrived late   PT Stop Time 0847   PT Time Calculation (min) 38 min   Activity Tolerance Patient tolerated treatment well   Behavior During Therapy [facility] for tasks assessed/performed      Past Medical History:  Diagnosis Date  . 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 Complication of anesthesia    difficult to wake up  . High cholesterol   . PONV (postoperative nausea and vomiting)     Past Surgical History:  Procedure Laterality Date  . LAPAROSCOPIC SALPINGO OOPHERECTOMY Bilateral 11/26/2016   Procedure: LAPAROSCOPIC BILATERAL SALPINGO OOPHORECTOMY;  Surgeon: Zelphia Cairo, MD;  Location: [facility];  Service: Gynecology;  Laterality: Bilateral;  . MASTECTOMY Bilateral    with reconstruction  . NIPPLE SPARING MASTECTOMY Bilateral 10/07/2016   Procedure: NIPPLE SPARING MASTECTOMY;  Surgeon: Almond Lint, MD;  Location: [facility];  Service: General;  Laterality: Bilateral;  . WISDOM TOOTH EXTRACTION      There were no vitals filed for this visit.      Subjective Assessment - 12/15/16 0810    Subjective My shoulder is good.    Pertinent History BRCA 1 positive with bilateral prophylactic mastectomy on 10/07/16 with no lymph nodes removed.  High cholesterol.  Has had PT in the past and chiropractic for pinched nerve in back; h/o hip bursitis and gets that pain shwen she walks a lot but that doesn't bother her when she weights less.   Patient Stated Goals try to get more flexibility and  ROM with hers arms   Currently in Pain? No/denies   Pain Score 0-No pain                         [facility] Adult PT Treatment/Exercise - 12/15/16 0001      Shoulder Exercises: Supine   Horizontal ABduction Strengthening;Both;10 reps;Theraband   Theraband Level (Shoulder Horizontal ABduction) Level 2 (Red)   External Rotation Strengthening;Both;10 reps;Theraband   Theraband Level (Shoulder External Rotation) Level 2 (Red)   Flexion Strengthening;Both;10 reps;Theraband  narrow and wide grip   Theraband Level (Shoulder Flexion) Level 2 (Red)   Other Supine Exercises D2 x 10 reps bilaterally with red theraband     Shoulder Exercises: Pulleys   Flexion 2 minutes   ABduction 2 minutes     Shoulder Exercises: Therapy Ball   Flexion 10 reps  With forward lean into end of stretch   ABduction 5 reps  Rt UE and LUE with side lean into end of stretch     Manual Therapy   Passive ROM to right shoulder with stretch to tolerance into D2, abduction, and flexion,                        [facility] Goals - 12/09/16 2706      CC Long Term Goal  #1   Title Pt. will show at least 150 degrees of active shoulder flexion bilaterally for improved  overhead reach   Baseline R 144 degrees, L 156 degrees; Rt 161degrees-12/09/16   Status Achieved     CC Long Term Goal  #2   Title Pt. will show at least 160 degrees of active shoulder abduction bilaterally for improved ADLs   Baseline R 145 degrees, L 174 degrees; Rt 164 degrees-12/09/16   Status Achieved     CC Long Term Goal  #3   Title Pt. will show at least 85 degrees shoulder er bilaterally for improved ADLs   Baseline Bil 90 degrees-12/09/16   Status Achieved     CC Long Term Goal  #4   Title Independent in HEP for shoulder ROM and strengthening   Status On-going     CC Long Term Goal  #5   Title Will report at least 50% decrease in discomfort with reaching with each arm.   Status On-going             Plan - 12/15/16 8366    Clinical Impression Statement Patient is still having some tightness in bilateral trunk when reaching up overhead at end range. She also has a pinching pain in your right upper outer shoulder with end range movement. Upgraded pt's supine scap exercises to red band today with pt able to complete without difficulty.    Rehab Potential Excellent   Clinical Impairments Affecting Rehab Potential none   PT Frequency 2x / week   PT Duration 4 weeks   PT Treatment/Interventions ADLs/Self Care Home Management;Therapeutic exercise;Patient/family education;Manual techniques;Manual lymph drainage;Scar mobilization;Passive range of motion   PT Next Visit Plan Review, Add to HEP as appropriate, especially given that patient will be coming less frequently to PT after next week between having abdominal surgery and then returning to work.  Continue ROM to both shoulders with focus on right.   PT Home Exercise Plan dowel and doorway exercises, supine scap, rockwood exercises   Consulted and Agree with Plan of Care Patient      Patient will benefit from skilled therapeutic intervention in order to improve the following deficits and impairments:  Decreased range of motion, Impaired UE functional use, Pain, Decreased scar mobility, Increased edema  Visit Diagnosis: Stiffness of right shoulder, not elsewhere classified  Stiffness of left shoulder, not elsewhere classified  Right shoulder pain, unspecified chronicity  Left shoulder pain, unspecified chronicity     Problem List Patient Active Problem List   Diagnosis Date Noted  . BRCA gene mutation positive 10/07/2016  . Headache(784.0) 03/09/2013  . 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

## 2016-12-19 ENCOUNTER — Encounter: Payer: BC Managed Care – PPO | Admitting: Physical Therapy

## 2016-12-22 ENCOUNTER — Ambulatory Visit: Payer: BC Managed Care – PPO | Admitting: Physical Therapy

## 2016-12-22 ENCOUNTER — Encounter: Payer: Self-pay | Admitting: Physical Therapy

## 2016-12-22 DIAGNOSIS — M25611 Stiffness of right shoulder, not elsewhere classified: Secondary | ICD-10-CM | POA: Diagnosis not present

## 2016-12-22 DIAGNOSIS — M25612 Stiffness of left shoulder, not elsewhere classified: Secondary | ICD-10-CM

## 2016-12-22 DIAGNOSIS — M25512 Pain in left shoulder: Secondary | ICD-10-CM

## 2016-12-22 DIAGNOSIS — M25511 Pain in right shoulder: Secondary | ICD-10-CM

## 2016-12-22 NOTE — Patient Instructions (Signed)

## 2016-12-22 NOTE — Therapy (Signed)
[place], [place], [address] Phone: [phone]   Fax:  [phone]  Physical Therapy Treatment  Patient Details  Name: Anne Fuller MRN: [id number] Date of Birth: 1975-11-23 Referring Provider: Dr. Irene Limbo  Encounter Date: 12/22/2016      PT End of Session - 12/22/16 0838    Visit Number 10   Number of Visits 15   Date for PT Re-Evaluation 01/19/17   PT Start Time 0814  pt arrived late   PT Stop Time 0847   PT Time Calculation (min) 33 min   Activity Tolerance Patient tolerated treatment well   Behavior During Therapy [facility] for tasks assessed/performed      Past Medical History:  Diagnosis Date  . 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 Complication of anesthesia    difficult to wake up  . High cholesterol   . PONV (postoperative nausea and vomiting)     Past Surgical History:  Procedure Laterality Date  . LAPAROSCOPIC SALPINGO OOPHERECTOMY Bilateral 11/26/2016   Procedure: LAPAROSCOPIC BILATERAL SALPINGO OOPHORECTOMY;  Surgeon: Marylynn Pearson, MD;  Location: [place];  Service: Gynecology;  Laterality: Bilateral;  . MASTECTOMY Bilateral    with reconstruction  . NIPPLE SPARING MASTECTOMY Bilateral 10/07/2016   Procedure: NIPPLE SPARING MASTECTOMY;  Surgeon: Stark Klein, MD;  Location: [place];  Service: General;  Laterality: Bilateral;  . WISDOM TOOTH EXTRACTION      There were no vitals filed for this visit.      Subjective Assessment - 12/22/16 0816    Subjective My right is still tight.    Pertinent History BRCA 1 positive with bilateral prophylactic mastectomy on 10/07/16 with no lymph nodes removed.  High cholesterol.  Has had PT in the past and chiropractic for pinched nerve in back; h/o hip bursitis and gets that pain shwen she walks a lot but that doesn't bother her when she weights less.   Patient Stated Goals try to get more  flexibility and ROM with hers arms   Currently in Pain? No/denies   Pain Score 0-No pain                         [facility] Adult PT Treatment/Exercise - 12/22/16 0001      Shoulder Exercises: Standing   Other Standing Exercises 3 way shoulder with 2 lb weights   Other Standing Exercises began instruction in Strength ABC program through crunches- instructed in pelvic tilts instead and held off on bird/dog exercise                        [facility] Goals - 12/22/16 0817      CC Long Term Goal  #1   Title Pt. will show at least 150 degrees of active shoulder flexion bilaterally for improved overhead reach   Baseline R 144 degrees, L 156 degrees; Rt 161degrees-12/09/16   Time 4   Period Weeks   Status Achieved     CC Long Term Goal  #2   Title Pt. will show at least 160 degrees of active shoulder abduction bilaterally for improved ADLs   Baseline R 145 degrees, L 174 degrees; Rt 164 degrees-12/09/16   Time 4   Period Weeks   Status Achieved     CC Long Term Goal  #3   Title Pt. will show at least 85 degrees shoulder er bilaterally for improved ADLs  Baseline Bil 90 degrees-12/09/16   Time 4   Period Weeks   Status Achieved     CC Long Term Goal  #4   Title Independent in HEP for shoulder ROM and strengthening   Time 3   Period Weeks   Status On-going     CC Long Term Goal  #5   Title Will report at least 50% decrease in discomfort with reaching with each arm.   Baseline 12/22/16- 90% on L, 80% on R   Time 4   Period Weeks   Status Achieved     CC Long Term Goal  #6   Title Pt will report no pain in R shoulder when flexing R arm at end range to allow improved comfort.   Time 4   Period Weeks   Status New   Target Date 01/19/17     Additional Goals   Additional Goals Yes            Plan - 12/22/16 1038    Clinical Impression Statement Assessed pt's progress towards goals in therapy. She has met all ROM goals and is  progressing towards independence with a home exercise program. Pt is still having pain when she raises her right arm most likely due to poor scapular stabilization. Will focus on strengthening exercises over the next few weeks and decrease to once per week. Pt would benefit from additional skilled PT visits to increase scapular strength and decrease pain with RUE movement.    Rehab Potential Excellent   Clinical Impairments Affecting Rehab Potential none   PT Frequency 1x / week   PT Duration 4 weeks   PT Treatment/Interventions ADLs/Self Care Home Management;Therapeutic exercise;Patient/family education;Manual techniques;Manual lymph drainage;Scar mobilization;Passive range of motion   PT Next Visit Plan continue instruction in Strength ABC program start at exercise on all 4s, assess indep with 3 way shoulder   PT Home Exercise Plan dowel and doorway exercises, supine scap, rockwood exercises   Consulted and Agree with Plan of Care Patient      Patient will benefit from skilled therapeutic intervention in order to improve the following deficits and impairments:  Decreased range of motion, Impaired UE functional use, Pain, Decreased scar mobility, Increased edema  Visit Diagnosis: Stiffness of right shoulder, not elsewhere classified - Plan: PT plan of care cert/re-cert  Stiffness of left shoulder, not elsewhere classified - Plan: PT plan of care cert/re-cert  Right shoulder pain, unspecified chronicity - Plan: PT plan of care cert/re-cert  Left shoulder pain, unspecified chronicity - Plan: PT plan of care cert/re-cert     Problem List Patient Active Problem List   Diagnosis Date Noted  . BRCA gene mutation positive 10/07/2016  . Headache(784.0) 03/09/2013  . 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

## 2016-12-24 ENCOUNTER — Ambulatory Visit: Payer: BC Managed Care – PPO | Admitting: Physical Therapy

## 2016-12-29 NOTE — H&P (Signed)
Subjective:     Patient ID: Anne Fuller is a 41 y.o. female.  HPI  Nearly 3 months post op. Plan implant exchange this month. Notes onset pain drainage 3 days ago from lap port scar abdomen.  Last MRI 07/2016 benign. Pathology benign.  Prior C, happy with this. Right mastectomy 520 g Left 540 g  She is on OCP, states this is not for birth control as husband had vasectomy but to suppress ovaries. She has scheduled BSO for 8.29.18  Review of Systems     Objective:   Physical Exam  Cardiovascular: Normal rate and normal heart sounds.   Pulmonary/Chest: Effort normal and breath sounds normal.    Chest: bilateral NAC hypopigmentation improving, Chest soft, depression at lateral chest wall Abd soft with <1 cm umbilical hernia, removed spitting suture from umbilicus Lower abdominal port scar with superficial separation 1.5 cm approximately with two vicryl sutures removed SN to nipple R 22 L 22 BW 14 L 13.5 (CW  Nipple to IMF R 8 L 8.5  Assessment:     BRCA1 S/p bilateral NSM, TE/ADM (Alloderm) reconstruction    Plan:     Plan bilateral implant exchange with lipofilling bilateral chest. Plan silicone round smooth. Discussed capacity filled implants to help reduce rippling. Discussed risk AP flipping in HCG and capacity filled implants is present and may require procedure to correct.   Discussed purpose of fat grafting to thicken flaps, aid with contour. Reviewed donor site pain bruising, risk contour abnormalities. Discussed variable take graft, may need to repeat, fat necrosis that presents as lumps. Discussed compression garment for abdomen for 6 weeks.  Additional risks including but not limited to bleeding, seroma, hematoma, DVT/PE, cardiopulmonary complications, rupture, capsular contracture, MRI surveillance silicone implants, asymmetry, unacceptable cosmetic appearance, damage to deeper structures reviewed.   Counseled no evidence infection abdominal  incision, appears superficial separation. Cover as needed.   Plan OP surgery. Notes she has multiple medications left over from last 2 surgeries- will let me know which medications she desires Rx at time of surgery.  Natrelle 133FV-12-T 400 ml tissue expanders placed bilateral,  fill volume 400 ml saline bilateral.     Irene Limbo, MD [facility] [phone], pin [phone]

## 2017-01-02 ENCOUNTER — Ambulatory Visit: Payer: BC Managed Care – PPO | Attending: Plastic Surgery | Admitting: Physical Therapy

## 2017-01-02 DIAGNOSIS — M25512 Pain in left shoulder: Secondary | ICD-10-CM | POA: Insufficient documentation

## 2017-01-02 DIAGNOSIS — M25611 Stiffness of right shoulder, not elsewhere classified: Secondary | ICD-10-CM | POA: Insufficient documentation

## 2017-01-02 DIAGNOSIS — M25612 Stiffness of left shoulder, not elsewhere classified: Secondary | ICD-10-CM | POA: Diagnosis present

## 2017-01-02 DIAGNOSIS — M25511 Pain in right shoulder: Secondary | ICD-10-CM | POA: Diagnosis present

## 2017-01-02 NOTE — Therapy (Signed)
[place], [place], [address] Phone: [phone]   Fax:  [phone]  Physical Therapy Treatment  Patient Details  Name: Anne Fuller MRN: [id number] Date of Birth: 09-04-1975 Referring Provider: Dr. Irene Limbo  Encounter Date: 01/02/2017      PT End of Session - 01/02/17 1032    Visit Number 11   Number of Visits 15   Date for PT Re-Evaluation 01/19/17   PT Start Time 0818  pt. arrived late   PT Stop Time 0847   PT Time Calculation (min) 29 min   Activity Tolerance Patient tolerated treatment well   Behavior During Therapy [facility] for tasks assessed/performed      Past Medical History:  Diagnosis Date  . 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 Complication of anesthesia    difficult to wake up  . High cholesterol   . PONV (postoperative nausea and vomiting)     Past Surgical History:  Procedure Laterality Date  . LAPAROSCOPIC SALPINGO OOPHERECTOMY Bilateral 11/26/2016   Procedure: LAPAROSCOPIC BILATERAL SALPINGO OOPHORECTOMY;  Surgeon: Marylynn Pearson, MD;  Location: [place];  Service: Gynecology;  Laterality: Bilateral;  . MASTECTOMY Bilateral    with reconstruction  . NIPPLE SPARING MASTECTOMY Bilateral 10/07/2016   Procedure: NIPPLE SPARING MASTECTOMY;  Surgeon: Stark Klein, MD;  Location: [place];  Service: General;  Laterality: Bilateral;  . WISDOM TOOTH EXTRACTION      There were no vitals filed for this visit.      Subjective Assessment - 01/02/17 0819    Subjective "I had a little setback this week with my incision from my last surgery (abdomen). It wasn't infected, but I saw the doctor.  I had four days where I couldn't walk very well and so I didn't do as much exercise." Incision has a superficial opening. Has done the 3-way raises and doesn't have questions on those.    Currently in Pain? No/denies                         [facility] Adult PT Treatment/Exercise - 01/02/17 0001      Self-Care   Self-Care Other Self-Care Comments   Other Self-Care Comments  reviewed instructions for Strength ABC program, gave log and instructed in its use     Elbow Exercises   Elbow Flexion Strengthening;Right;Left;20 reps;Bar weights/barbell   Bar Weights/Barbell (Elbow Flexion) 2 lbs   Elbow Extension Strengthening;Right;Left;10 reps;Standing;Bar weights/barbell  tricep Banker Weights/Barbell (Elbow Extension) 2 lbs     Knee/Hip Exercises: Standing   Heel Raises Both;20 reps;1 second  2 lb. weights in each hand   Hip ADduction AROM;Right;Left;10 reps;2 sets   Forward Step Up Right;Left;10 reps;Hand Hold: 0;Step Height: 8"     Knee/Hip Exercises: Seated   Sit to Sand 10 reps;without UE support;2 sets     Shoulder Exercises: Supine   Other Supine Exercises chest press with 1 lb. weight each hand x 10x2     Shoulder Exercises: Standing   Row Strengthening;Right;Left;Weights;10 reps  2 sets   Row Weight (lbs) 1   Other Standing Exercises scaption with 1 lb. each hand x 10 x2                PT Education - 01/02/17 1031    Education provided Yes   Education Details strength after breast cancer program, resistance exercise; reviewed 3-way arm raises with cueing to lift to shoulder height  only   Person(s) Educated Patient   Methods Explanation;Verbal cues   Comprehension Verbalized understanding;Returned demonstration                [facility] Goals - 12/22/16 [phone]      CC Long Term Goal  #1   Title Pt. will show at least 150 degrees of active shoulder flexion bilaterally for improved overhead reach   Baseline R 144 degrees, L 156 degrees; Rt 161degrees-12/09/16   Time 4   Period Weeks   Status Achieved     CC Long Term Goal  #2   Title Pt. will show at least 160 degrees of active shoulder abduction bilaterally for improved ADLs   Baseline R 145 degrees, L 174 degrees; Rt 164  degrees-12/09/16   Time 4   Period Weeks   Status Achieved     CC Long Term Goal  #3   Title Pt. will show at least 85 degrees shoulder er bilaterally for improved ADLs   Baseline Bil 90 degrees-12/09/16   Time 4   Period Weeks   Status Achieved     CC Long Term Goal  #4   Title Independent in HEP for shoulder ROM and strengthening   Time 3   Period Weeks   Status On-going     CC Long Term Goal  #5   Title Will report at least 50% decrease in discomfort with reaching with each arm.   Baseline 12/22/16- 90% on L, 80% on R   Time 4   Period Weeks   Status Achieved     CC Long Term Goal  #6   Title Pt will report no pain in R shoulder when flexing R arm at end range to allow improved comfort.   Time 4   Period Weeks   Status New   Target Date 01/19/17     Additional Goals   Additional Goals Yes            Plan - 01/02/17 1032    Clinical Impression Statement Pt. had a setback this week with her abdominal incison having opened up superficially, but she saw the doctor and this is now resolving.  She has limited exercise because of this, and we did not do the quadruped arm and leg raise exercise so as to avoid aggravating abdominals.   Rehab Potential Excellent   Clinical Impairments Affecting Rehab Potential none   PT Frequency 1x / week   PT Duration 4 weeks   PT Treatment/Interventions ADLs/Self Care Home Management;Therapeutic exercise;Patient/family education;Manual techniques;Manual lymph drainage;Scar mobilization;Passive range of motion   PT Next Visit Plan If abdomen is healed up, go over quadruped arm and leg raise from strength ABC program; review strength ABC program prn; assess patient's HEP and modify or advise as needed   PT Home Exercise Plan dowel and doorway exercises, supine scap, rockwood exercises   Consulted and Agree with Plan of Care Patient      Patient will benefit from skilled therapeutic intervention in order to improve the following deficits  and impairments:  Decreased range of motion, Impaired UE functional use, Pain, Decreased scar mobility, Increased edema  Visit Diagnosis: Stiffness of right shoulder, not elsewhere classified  Stiffness of left shoulder, not elsewhere classified  Right shoulder pain, unspecified chronicity  Left shoulder pain, unspecified chronicity     Problem List Patient Active Problem List   Diagnosis Date Noted  . BRCA gene mutation positive 10/07/2016  . Headache(784.0) 03/09/2013  . BRCA1  genetic carrier 09/04/2011    SALISBURY,DONNA 01/02/2017, 10:37 AM  [place], [place], [address] Phone: [phone]   Fax:  [phone]  Name: Anne Fuller MRN: [id number] Date of Birth: Aug 31, 1975  Anne Fuller, PT 01/02/17 10:37 AM

## 2017-01-06 ENCOUNTER — Ambulatory Visit: Payer: BC Managed Care – PPO

## 2017-01-06 DIAGNOSIS — M25611 Stiffness of right shoulder, not elsewhere classified: Secondary | ICD-10-CM | POA: Diagnosis not present

## 2017-01-06 DIAGNOSIS — M25511 Pain in right shoulder: Secondary | ICD-10-CM

## 2017-01-06 DIAGNOSIS — M25512 Pain in left shoulder: Secondary | ICD-10-CM

## 2017-01-06 DIAGNOSIS — M25612 Stiffness of left shoulder, not elsewhere classified: Secondary | ICD-10-CM

## 2017-01-06 NOTE — Therapy (Signed)
[place], [place], [address] Phone: [phone]   Fax:  [phone]  Physical Therapy Treatment  Patient Details  Name: Anne Fuller MRN: [id number] Date of Birth: [birth year] Referring Provider: Dr. Irene Limbo  Encounter Date: 01/06/2017      PT End of Session - 01/06/17 0849    Visit Number 12   Number of Visits 15   Date for PT Re-Evaluation 01/19/17   PT Start Time 0811  Pt arrived late   PT Stop Time 0846   PT Time Calculation (min) 35 min   Activity Tolerance Patient tolerated treatment well   Behavior During Therapy [facility] for tasks assessed/performed      Past Medical History:  Diagnosis Date  . 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 Complication of anesthesia    difficult to wake up  . High cholesterol   . PONV (postoperative nausea and vomiting)     Past Surgical History:  Procedure Laterality Date  . LAPAROSCOPIC SALPINGO OOPHERECTOMY Bilateral 11/26/2016   Procedure: LAPAROSCOPIC BILATERAL SALPINGO OOPHORECTOMY;  Surgeon: Marylynn Pearson, MD;  Location: [place];  Service: Gynecology;  Laterality: Bilateral;  . MASTECTOMY Bilateral    with reconstruction  . NIPPLE SPARING MASTECTOMY Bilateral 10/07/2016   Procedure: NIPPLE SPARING MASTECTOMY;  Surgeon: Stark Klein, MD;  Location: [place];  Service: General;  Laterality: Bilateral;  . WISDOM TOOTH EXTRACTION      There were no vitals filed for this visit.      Subjective Assessment - 01/06/17 0813    Subjective Abdominal incision is healing well, has closed up. Walked alot on Saturday and did fine, I was tired that night but I wasn't limited during the day.    Pertinent History BRCA 1 positive with bilateral prophylactic mastectomy on 10/07/16 with no lymph nodes removed.  High cholesterol.  Has had PT in the past and chiropractic for pinched nerve in back; h/o hip bursitis and gets that  pain shwen she walks a lot but that doesn't bother her when she weights less.   Patient Stated Goals try to get more flexibility and ROM with hers arms   Currently in Pain? No/denies                         [facility] Adult PT Treatment/Exercise - 01/06/17 0001      Lumbar Exercises: Quadruped   Opposite Arm/Leg Raise 5 reps   Opposite Arm/Leg Raise Limitations Therapist demonstrated for pt and then tactile cuing required to decrease trunk rotation and extension with Lt LE, able to do correctly with Rt LE     Shoulder Exercises: Standing   Other Standing Exercises scaption with 1 lb. each hand and then elbow flexion alternating each set 2 sets of 10 each     Manual Therapy   Myofascial Release To Rt axilla during P/ROM to pts tolerance   Passive ROM to right shoulder with stretch to tolerance into D2, abduction, and flexion,                        [facility] Goals - 12/22/16 0737      CC Long Term Goal  #1   Title Pt. will show at least 150 degrees of active shoulder flexion bilaterally for improved overhead reach   Baseline R 144 degrees, L 156 degrees; Rt 161degrees-12/09/16   Time 4   Period Weeks  Status Achieved     CC Long Term Goal  #2   Title Pt. will show at least 160 degrees of active shoulder abduction bilaterally for improved ADLs   Baseline R 145 degrees, L 174 degrees; Rt 164 degrees-12/09/16   Time 4   Period Weeks   Status Achieved     CC Long Term Goal  #3   Title Pt. will show at least 85 degrees shoulder er bilaterally for improved ADLs   Baseline Bil 90 degrees-12/09/16   Time 4   Period Weeks   Status Achieved     CC Long Term Goal  #4   Title Independent in HEP for shoulder ROM and strengthening   Time 3   Period Weeks   Status On-going     CC Long Term Goal  #5   Title Will report at least 50% decrease in discomfort with reaching with each arm.   Baseline 12/22/16- 90% on L, 80% on R   Time 4   Period Weeks    Status Achieved     CC Long Term Goal  #6   Title Pt will report no pain in R shoulder when flexing R arm at end range to allow improved comfort.   Time 4   Period Weeks   Status New   Target Date 01/19/17     Additional Goals   Additional Goals Yes            Plan - 01/06/17 0849    Clinical Impression Statement Finalized instruction of Strength ABC Program with quadruped and reviewed a few f the exercises. Pt had reported early in session that she was still feeling pretty limited with her Rt UE end ROM and wanted to focus on stretching so ended session with that. After pt reported really feeling benefit of that and wanting to focus on that next session. She has one more before removal of expanders next Friday.   Rehab Potential Excellent   Clinical Impairments Affecting Rehab Potential none   PT Frequency 1x / week   PT Duration 4 weeks   PT Treatment/Interventions ADLs/Self Care Home Management;Therapeutic exercise;Patient/family education;Manual techniques;Manual lymph drainage;Scar mobilization;Passive range of motion   PT Next Visit Plan Answer any questions if any about HEP and focus on end ROM stretching of Rt UE.   Consulted and Agree with Plan of Care Patient      Patient will benefit from skilled therapeutic intervention in order to improve the following deficits and impairments:  Decreased range of motion, Impaired UE functional use, Pain, Decreased scar mobility, Increased edema  Visit Diagnosis: Stiffness of right shoulder, not elsewhere classified  Stiffness of left shoulder, not elsewhere classified  Right shoulder pain, unspecified chronicity  Left shoulder pain, unspecified chronicity     Problem List Patient Active Problem List   Diagnosis Date Noted  . BRCA gene mutation positive 10/07/2016  . Headache(784.0) 03/09/2013  . 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

## 2017-01-09 ENCOUNTER — Encounter (HOSPITAL_BASED_OUTPATIENT_CLINIC_OR_DEPARTMENT_OTHER): Payer: Self-pay | Admitting: *Deleted

## 2017-01-13 ENCOUNTER — Ambulatory Visit: Payer: BC Managed Care – PPO

## 2017-01-13 DIAGNOSIS — M25611 Stiffness of right shoulder, not elsewhere classified: Secondary | ICD-10-CM

## 2017-01-13 DIAGNOSIS — M25511 Pain in right shoulder: Secondary | ICD-10-CM

## 2017-01-13 NOTE — Progress Notes (Signed)
NPO after midnight except complete ensure pre surg by 0415. Also hibliclens given with instructions. Pt verbalized understanding.
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
## 2017-01-16 ENCOUNTER — Encounter (HOSPITAL_BASED_OUTPATIENT_CLINIC_OR_DEPARTMENT_OTHER): Payer: Self-pay

## 2017-01-16 ENCOUNTER — Encounter (HOSPITAL_BASED_OUTPATIENT_CLINIC_OR_DEPARTMENT_OTHER)
Admission: RE | Disposition: A | Discharge status: Home / Self Care | Payer: Self-pay | Source: Ambulatory Visit | Attending: Plastic Surgery

## 2017-01-16 ENCOUNTER — Ambulatory Visit (HOSPITAL_BASED_OUTPATIENT_CLINIC_OR_DEPARTMENT_OTHER): Payer: BC Managed Care – PPO | Admitting: Anesthesiology

## 2017-01-16 ENCOUNTER — Ambulatory Visit (HOSPITAL_BASED_OUTPATIENT_CLINIC_OR_DEPARTMENT_OTHER)
Admission: RE | Admit: 2017-01-16 | Discharge: 2017-01-16 | Disposition: A | Discharge status: Home / Self Care | Payer: BC Managed Care – PPO | Source: Ambulatory Visit | Attending: Plastic Surgery | Admitting: Plastic Surgery

## 2017-01-16 DIAGNOSIS — Z1501 Genetic susceptibility to malignant neoplasm of breast: Secondary | ICD-10-CM | POA: Diagnosis not present

## 2017-01-16 DIAGNOSIS — Z421 Encounter for breast reconstruction following mastectomy: Secondary | ICD-10-CM | POA: Insufficient documentation

## 2017-01-16 HISTORY — PX: REMOVAL OF BILATERAL TISSUE EXPANDERS WITH PLACEMENT OF BILATERAL BREAST IMPLANTS: SHX6431

## 2017-01-16 HISTORY — PX: LIPOSUCTION WITH LIPOFILLING: SHX6436

## 2017-01-16 LAB — POCT HEMOGLOBIN-HEMACUE: Hemoglobin: 13.6 g/dL (ref 12.0–15.0)

## 2017-01-16 SURGERY — REMOVAL, TISSUE EXPANDER, BREAST, BILATERAL, WITH BILATERAL IMPLANT IMPLANT INSERTION
Anesthesia: General | Laterality: Bilateral

## 2017-01-16 MED ORDER — ACETAMINOPHEN 500 MG PO TABS
ORAL_TABLET | ORAL | Status: AC
  Filled 2017-01-16: qty 2

## 2017-01-16 MED ORDER — MEPERIDINE HCL 25 MG/ML IJ SOLN: 6.2500 mg | INTRAMUSCULAR | Status: DC | PRN

## 2017-01-16 MED ORDER — ONDANSETRON HCL 4 MG/2ML IJ SOLN
INTRAMUSCULAR | Status: AC
  Filled 2017-01-16: qty 2

## 2017-01-16 MED ORDER — SODIUM BICARBONATE 4 % IV SOLN
INTRAVENOUS | Status: AC
Start: 2017-01-16 — End: 2017-01-16
  Filled 2017-01-16: qty 10

## 2017-01-16 MED ORDER — LACTATED RINGERS IV SOLN
INTRAVENOUS | Status: DC
  Administered 2017-01-16 (×2): via INTRAVENOUS

## 2017-01-16 MED ORDER — SODIUM CHLORIDE 0.9 % IV SOLN
INTRAVENOUS | Status: DC | PRN
  Administered 2017-01-16: 1000 mL

## 2017-01-16 MED ORDER — FENTANYL CITRATE (PF) 100 MCG/2ML IJ SOLN
INTRAMUSCULAR | Status: AC
  Filled 2017-01-16: qty 2

## 2017-01-16 MED ORDER — MIDAZOLAM HCL 2 MG/2ML IJ SOLN
1.0000 mg | INTRAMUSCULAR | Status: DC | PRN
Start: 2017-01-16 — End: 2017-01-16

## 2017-01-16 MED ORDER — PROPOFOL 10 MG/ML IV BOLUS
INTRAVENOUS | Status: AC
  Filled 2017-01-16: qty 20

## 2017-01-16 MED ORDER — OXYCODONE HCL 5 MG/5ML PO SOLN: 5.0000 mg | Freq: Once | ORAL | Status: DC | PRN

## 2017-01-16 MED ORDER — FENTANYL CITRATE (PF) 100 MCG/2ML IJ SOLN: 50.0000 ug | INTRAMUSCULAR | Status: DC | PRN

## 2017-01-16 MED ORDER — ACETAMINOPHEN-CODEINE #3 300-30 MG PO TABS: 1.0000 | ORAL_TABLET | ORAL | 0 refills | Status: AC | PRN

## 2017-01-16 MED ORDER — LIDOCAINE HCL (PF) 1 % IJ SOLN
INTRAMUSCULAR | Status: AC
  Filled 2017-01-16: qty 60

## 2017-01-16 MED ORDER — FENTANYL CITRATE (PF) 100 MCG/2ML IJ SOLN
INTRAMUSCULAR | Status: DC | PRN
  Administered 2017-01-16 (×4): 50 ug via INTRAVENOUS

## 2017-01-16 MED ORDER — SCOPOLAMINE 1 MG/3DAYS TD PT72
1.0000 | MEDICATED_PATCH | Freq: Once | TRANSDERMAL | Status: AC | PRN
  Administered 2017-01-16: 1 via TRANSDERMAL

## 2017-01-16 MED ORDER — MIDAZOLAM HCL 2 MG/2ML IJ SOLN
INTRAMUSCULAR | Status: DC | PRN
  Administered 2017-01-16: 1 mg via INTRAVENOUS

## 2017-01-16 MED ORDER — CELECOXIB 200 MG PO CAPS
ORAL_CAPSULE | ORAL | Status: AC
  Filled 2017-01-16: qty 1

## 2017-01-16 MED ORDER — DEXAMETHASONE SODIUM PHOSPHATE 10 MG/ML IJ SOLN
INTRAMUSCULAR | Status: AC
  Filled 2017-01-16: qty 1

## 2017-01-16 MED ORDER — OXYCODONE HCL 5 MG PO TABS: 5.0000 mg | ORAL_TABLET | Freq: Once | ORAL | Status: DC | PRN

## 2017-01-16 MED ORDER — CELECOXIB 200 MG PO CAPS
200.0000 mg | ORAL_CAPSULE | ORAL | Status: AC
  Administered 2017-01-16: 200 mg via ORAL

## 2017-01-16 MED ORDER — LIDOCAINE HCL (CARDIAC) 20 MG/ML IV SOLN
INTRAVENOUS | Status: DC | PRN
  Administered 2017-01-16: 50 mg via INTRAVENOUS

## 2017-01-16 MED ORDER — MIDAZOLAM HCL 2 MG/2ML IJ SOLN
INTRAMUSCULAR | Status: AC
Start: 2017-01-16 — End: 2017-01-16
  Filled 2017-01-16: qty 2

## 2017-01-16 MED ORDER — LIDOCAINE 2% (20 MG/ML) 5 ML SYRINGE
INTRAMUSCULAR | Status: AC
  Filled 2017-01-16: qty 5

## 2017-01-16 MED ORDER — GABAPENTIN 300 MG PO CAPS
ORAL_CAPSULE | ORAL | Status: AC
  Filled 2017-01-16: qty 1

## 2017-01-16 MED ORDER — SUGAMMADEX SODIUM 200 MG/2ML IV SOLN
INTRAVENOUS | Status: DC | PRN
  Administered 2017-01-16: 173.2 mg via INTRAVENOUS

## 2017-01-16 MED ORDER — HYDROMORPHONE HCL 1 MG/ML IJ SOLN
INTRAMUSCULAR | Status: AC
  Filled 2017-01-16: qty 0.5

## 2017-01-16 MED ORDER — EPINEPHRINE 30 MG/30ML IJ SOLN
INTRAMUSCULAR | Status: AC
  Filled 2017-01-16: qty 1

## 2017-01-16 MED ORDER — CEFAZOLIN SODIUM-DEXTROSE 2-4 GM/100ML-% IV SOLN
INTRAVENOUS | Status: AC
  Filled 2017-01-16: qty 100

## 2017-01-16 MED ORDER — ROCURONIUM BROMIDE 10 MG/ML (PF) SYRINGE
PREFILLED_SYRINGE | INTRAVENOUS | Status: AC
  Filled 2017-01-16: qty 5

## 2017-01-16 MED ORDER — DEXAMETHASONE SODIUM PHOSPHATE 4 MG/ML IJ SOLN
INTRAMUSCULAR | Status: DC | PRN
  Administered 2017-01-16: 10 mg via INTRAVENOUS

## 2017-01-16 MED ORDER — SUGAMMADEX SODIUM 200 MG/2ML IV SOLN
INTRAVENOUS | Status: AC
  Filled 2017-01-16: qty 2

## 2017-01-16 MED ORDER — CEFAZOLIN SODIUM-DEXTROSE 2-4 GM/100ML-% IV SOLN
2.0000 g | INTRAVENOUS | Status: AC
  Administered 2017-01-16: 2 g via INTRAVENOUS

## 2017-01-16 MED ORDER — HYDROMORPHONE HCL 1 MG/ML IJ SOLN
0.2500 mg | INTRAMUSCULAR | Status: DC | PRN
  Administered 2017-01-16: 0.25 mg via INTRAVENOUS
  Administered 2017-01-16: 0.5 mg via INTRAVENOUS

## 2017-01-16 MED ORDER — PROMETHAZINE HCL 25 MG/ML IJ SOLN: 6.2500 mg | INTRAMUSCULAR | Status: DC | PRN

## 2017-01-16 MED ORDER — ROCURONIUM BROMIDE 100 MG/10ML IV SOLN
INTRAVENOUS | Status: DC | PRN
  Administered 2017-01-16: 40 mg via INTRAVENOUS

## 2017-01-16 MED ORDER — BUPIVACAINE-EPINEPHRINE (PF) 0.25% -1:200000 IJ SOLN
INTRAMUSCULAR | Status: AC
  Filled 2017-01-16: qty 60

## 2017-01-16 MED ORDER — ACETAMINOPHEN 500 MG PO TABS
1000.0000 mg | ORAL_TABLET | ORAL | Status: AC
  Administered 2017-01-16: 1000 mg via ORAL

## 2017-01-16 MED ORDER — CHLORHEXIDINE GLUCONATE CLOTH 2 % EX PADS: 6.0000 | MEDICATED_PAD | Freq: Once | CUTANEOUS | Status: DC

## 2017-01-16 MED ORDER — GABAPENTIN 300 MG PO CAPS
300.0000 mg | ORAL_CAPSULE | ORAL | Status: AC
  Administered 2017-01-16: 300 mg via ORAL

## 2017-01-16 MED ORDER — SULFAMETHOXAZOLE-TRIMETHOPRIM 800-160 MG PO TABS: 1.0000 | ORAL_TABLET | Freq: Two times a day (BID) | ORAL | 0 refills | Status: AC

## 2017-01-16 MED ORDER — LACTATED RINGERS IV SOLN
INTRAVENOUS | Status: DC | PRN
  Administered 2017-01-16: 525 mL via INTRAMUSCULAR

## 2017-01-16 SURGICAL SUPPLY — 82 items
BAG DECANTER FOR FLEXI CONT (MISCELLANEOUS) ×2 IMPLANT
BINDER ABDOMINAL 10 UNV 27-48 (MISCELLANEOUS) ×2 IMPLANT
BINDER ABDOMINAL 12 SM 30-45 (SOFTGOODS) IMPLANT
BINDER BREAST 3XL (GAUZE/BANDAGES/DRESSINGS) IMPLANT
BINDER BREAST LRG (GAUZE/BANDAGES/DRESSINGS) IMPLANT
BINDER BREAST MEDIUM (GAUZE/BANDAGES/DRESSINGS) IMPLANT
BINDER BREAST XLRG (GAUZE/BANDAGES/DRESSINGS) ×2 IMPLANT
BINDER BREAST XXLRG (GAUZE/BANDAGES/DRESSINGS) IMPLANT
BLADE SURG 10 STRL SS (BLADE) ×4 IMPLANT
BLADE SURG 11 STRL SS (BLADE) ×2 IMPLANT
BNDG GAUZE ELAST 4 BULKY (GAUZE/BANDAGES/DRESSINGS) ×4 IMPLANT
CANISTER LIPO FAT HARVEST (MISCELLANEOUS) ×2 IMPLANT
CANISTER SUCT 1200ML W/VALVE (MISCELLANEOUS) ×4 IMPLANT
CHLORAPREP W/TINT 26ML (MISCELLANEOUS) ×2 IMPLANT
COVER BACK TABLE 60X90IN (DRAPES) ×2 IMPLANT
COVER MAYO STAND STRL (DRAPES) ×4 IMPLANT
DECANTER SPIKE VIAL GLASS SM (MISCELLANEOUS) IMPLANT
DERMABOND ADVANCED (GAUZE/BANDAGES/DRESSINGS) ×2
DERMABOND ADVANCED .7 DNX12 (GAUZE/BANDAGES/DRESSINGS) ×2 IMPLANT
DRAIN CHANNEL 15F RND FF W/TCR (WOUND CARE) IMPLANT
DRAPE TOP ARMCOVERS (MISCELLANEOUS) ×2 IMPLANT
DRAPE U-SHAPE 76X120 STRL (DRAPES) ×2 IMPLANT
DRAPE UTILITY XL STRL (DRAPES) IMPLANT
DRSG PAD ABDOMINAL 8X10 ST (GAUZE/BANDAGES/DRESSINGS) ×8 IMPLANT
ELECT BLADE 4.0 EZ CLEAN MEGAD (MISCELLANEOUS) ×2
ELECT COATED BLADE 2.86 ST (ELECTRODE) ×2 IMPLANT
ELECT REM PT RETURN 9FT ADLT (ELECTROSURGICAL) ×2
ELECTRODE BLDE 4.0 EZ CLN MEGD (MISCELLANEOUS) ×1 IMPLANT
ELECTRODE REM PT RTRN 9FT ADLT (ELECTROSURGICAL) ×1 IMPLANT
EVACUATOR SILICONE 100CC (DRAIN) IMPLANT
GLOVE BIO SURGEON STRL SZ 6 (GLOVE) IMPLANT
GLOVE BIOGEL PI IND STRL 7.0 (GLOVE) ×1 IMPLANT
GLOVE BIOGEL PI INDICATOR 7.0 (GLOVE) ×1
GLOVE SURG SS PI 6.0 STRL IVOR (GLOVE) ×6 IMPLANT
GLOVE SURG SS PI 6.5 STRL IVOR (GLOVE) ×4 IMPLANT
GOWN STRL REUS W/ TWL LRG LVL3 (GOWN DISPOSABLE) ×3 IMPLANT
GOWN STRL REUS W/TWL LRG LVL3 (GOWN DISPOSABLE) ×3
IMPL BREAST FULL SHL 560 (Breast) ×2 IMPLANT
IMPL BRST FULL SHL 560CC (Breast) ×2 IMPLANT
IMPLANT BREAST GEL 560CC (Breast) ×2 IMPLANT
IV NS 500ML (IV SOLUTION)
IV NS 500ML BAXH (IV SOLUTION) IMPLANT
KIT FILL SYSTEM UNIVERSAL (SET/KITS/TRAYS/PACK) IMPLANT
LINER CANISTER 1000CC FLEX (MISCELLANEOUS) ×2 IMPLANT
MARKER SKIN DUAL TIP RULER LAB (MISCELLANEOUS) IMPLANT
NDL SAFETY ECLIPSE 18X1.5 (NEEDLE) ×1 IMPLANT
NEEDLE HYPO 18GX1.5 SHARP (NEEDLE) ×1
NEEDLE HYPO 25X1 1.5 SAFETY (NEEDLE) IMPLANT
NS IRRIG 1000ML POUR BTL (IV SOLUTION) ×4 IMPLANT
PACK BASIN DAY SURGERY FS (CUSTOM PROCEDURE TRAY) ×2 IMPLANT
PAD ALCOHOL SWAB (MISCELLANEOUS) ×2 IMPLANT
PENCIL BUTTON HOLSTER BLD 10FT (ELECTRODE) ×2 IMPLANT
PIN SAFETY STERILE (MISCELLANEOUS) IMPLANT
SHEET MEDIUM DRAPE 40X70 STRL (DRAPES) ×4 IMPLANT
SIZER BREAST REUSE 560CC (SIZER) ×1
SIZER BREAST REUSE GEL 560CC (SIZER) ×2
SIZER BREAST REUSE GEL 580CC (SIZER) ×2
SIZER BRST REUSE GEL 560CC (SIZER) ×1 IMPLANT
SIZER BRST REUSE GEL 580CC (SIZER) ×1 IMPLANT
SIZER BRST REUSE P5.7X 560CC (SIZER) ×1 IMPLANT
SLEEVE SCD COMPRESS KNEE MED (MISCELLANEOUS) ×2 IMPLANT
SPONGE LAP 18X18 X RAY DECT (DISPOSABLE) ×6 IMPLANT
STAPLER VISISTAT 35W (STAPLE) ×2 IMPLANT
SUT ETHILON 2 0 FS 18 (SUTURE) IMPLANT
SUT MNCRL AB 4-0 PS2 18 (SUTURE) ×4 IMPLANT
SUT PDS AB 2-0 CT2 27 (SUTURE) IMPLANT
SUT VIC AB 3-0 PS1 18 (SUTURE)
SUT VIC AB 3-0 PS1 18XBRD (SUTURE) IMPLANT
SUT VIC AB 3-0 SH 27 (SUTURE) ×3
SUT VIC AB 3-0 SH 27X BRD (SUTURE) ×3 IMPLANT
SUT VICRYL 4-0 PS2 18IN ABS (SUTURE) ×6 IMPLANT
SYR 10ML LL (SYRINGE) ×10 IMPLANT
SYR 50ML LL SCALE MARK (SYRINGE) ×8 IMPLANT
SYR BULB IRRIGATION 50ML (SYRINGE) ×4 IMPLANT
SYR CONTROL 10ML LL (SYRINGE) IMPLANT
SYR TB 1ML LL NO SAFETY (SYRINGE) ×2 IMPLANT
TOWEL OR 17X24 6PK STRL BLUE (TOWEL DISPOSABLE) ×6 IMPLANT
TUBE CONNECTING 20X1/4 (TUBING) ×4 IMPLANT
TUBING INFILTRATION IT-10001 (TUBING) ×2 IMPLANT
TUBING SET GRADUATE ASPIR 12FT (MISCELLANEOUS) ×2 IMPLANT
UNDERPAD 30X30 (UNDERPADS AND DIAPERS) ×4 IMPLANT
YANKAUER SUCT BULB TIP NO VENT (SUCTIONS) ×2 IMPLANT

## 2017-01-16 NOTE — Discharge Instructions (Signed)

## 2017-01-16 NOTE — Interval H&P Note (Signed)
History and Physical Interval Note:  01/16/2017 6:42 AM  Anne Fuller  has presented today for surgery, with the diagnosis of AQUIRED ABSENCE OF BREAST  The various methods of treatment have been discussed with the patient and family. After consideration of risks, benefits and other options for treatment, the patient has consented to  Procedure(s): REMOVAL OF BILATERAL TISSUE EXPANDERS WITH PLACEMENT OF BILATERAL SILICONE BREAST IMPLANTS (Bilateral) LIPOFILLING FROM ABDOMIN TO BILATERAL CHEST (Bilateral) as a surgical intervention .  The patient's history has been reviewed, patient examined, no change in status, stable for surgery.  I have reviewed the patient's chart and labs.  Questions were answered to the patient's satisfaction.     Anne Fuller

## 2017-01-16 NOTE — Op Note (Signed)
Operative Note   DATE OF OPERATION: 10.19.18  LOCATION: [facility]-outpatient  SURGICAL DIVISION: Plastic Surgery  PREOPERATIVE DIAGNOSES:  1. Acquired absence breasts 2. BRCA1 carrier  POSTOPERATIVE DIAGNOSES:  same  PROCEDURE:  1. Removal bilateral tissue expanders and placement silicone implants 2. Lipofilling from abdomen to bilateral chest  SURGEON: Irene Limbo MD MBA  ASSISTANT: none  ANESTHESIA:  General.   EBL: 40 ml  COMPLICATIONS: None immediate.   INDICATIONS FOR PROCEDURE:  The patient, Anne Fuller, is a 41 y.o. female born on 1975/05/18, is here for second stage reconstruction following bilateral nipple sparing mastectomies and immediate prepectoral expander based reconstruction.   FINDINGS: Well incorporated acellular dermis bilateral. Natrelle Inspira Smooth Round Full Projection 560 ml implant placed bilateral. REF SRF-560 RIGHT [id number] LEFT SN [id number]  DESCRIPTION OF PROCEDURE:  The patient's operative site was marked with the patient in the preoperative area to mark sternal notch, anterior axillary lines, chest midline. Supra and infraumbilical abdomen marked for donor site for fat grafting. The patientwas taken to the operating room. SCDs were placed and IV antibiotics were given. The patient's operative site was prepped and draped in a sterile fashion. A time out was performed and all information was confirmed to be correct.Incision made in right inframammary fold mastectomy scar. Incision carried through superficial fascia and acellular dermis. Expander removed. Examination of cavity with incorporated acellular dermis. Sizer placed. I then directed attention to left chest. Incision made in prior mastectomy scar and superficial fascia and acellular dermis incised. Expander removed. Sizer placed and patient brought to sitting position. Inspira Smooth Round Full Projection 560 implant chosen bilateral.   Stab incision made over bilateral lateral  abdomen and tumescent fluid infiltrated over supra and infraumbilical abdomen,total 881 ml tumescent infiltrated. Power assisted liposuction performed to endpoint symmetric contour and soft tissue thickness, total lipoaspirate 350 ml. The fat was then washed and prepared by gravity for infiltration. Harvested fat was then infiltrated in subcutaneous plane throughout total envelope mastectomy flaps.   At this time breast cavities irrigated with solution containing Ancef, gentamicin, and bacitracin, followed by Betadine. Implant placed in right breast cavity. Care taken to ensure proper orientation. Closure was completed with running 3-0 vicryl for approximation of acellular dermis and superficial fascia. 4-0 vicryl was placed in dermis and running 4-0 monocryl was used to close skin.Overleft breast following implant placement, closure completed with running 3-0 vicryl to approximate ADM and superficial fascia. 4-0 vicryl was placed in dermis and running 4-0 monocryl was used to close skin.  Tissue adhesive applied to breast incisions. The abdomen incisions were approximated with 4-0 monocryl. Dry dressing and compression garment and breast binders applied.   The patient was allowed to wake from anesthesia, extubated and taken to the recovery room in satisfactory condition.   SPECIMENS: none  DRAINS: none  Irene Limbo, MD [facility] Plastic & Reconstructive Surgery [phone], pin [phone]

## 2017-01-16 NOTE — Anesthesia Postprocedure Evaluation (Signed)
Anesthesia Post Note  Patient: Anne Fuller  Procedure(s) Performed: REMOVAL OF BILATERAL TISSUE EXPANDERS WITH PLACEMENT OF BILATERAL SILICONE BREAST IMPLANTS (Bilateral ) LIPOFILLING FROM ABDOMIN TO BILATERAL CHEST (Bilateral )     Patient location during evaluation: PACU Anesthesia Type: General Level of consciousness: sedated Pain management: pain level controlled Vital Signs Assessment: post-procedure vital signs reviewed and stable Respiratory status: spontaneous breathing and respiratory function stable Cardiovascular status: stable Postop Assessment: no apparent nausea or vomiting Anesthetic complications: no    Last Vitals:  Vitals:   01/16/17 1115 01/16/17 1230  BP: 121/81 118/73  Pulse: 86 83  Resp: 11 18  Temp:  36.6 C  SpO2: 99% 100%    Last Pain:  Vitals:   01/16/17 1230  TempSrc: Oral  PainSc: 4                  Ahaana Rochette DANIEL

## 2017-01-16 NOTE — Transfer of Care (Signed)
Immediate Anesthesia Transfer of Care Note  Patient: Anne Fuller  Procedure(s) Performed: REMOVAL OF BILATERAL TISSUE EXPANDERS WITH PLACEMENT OF BILATERAL SILICONE BREAST IMPLANTS (Bilateral ) LIPOFILLING FROM ABDOMIN TO BILATERAL CHEST (Bilateral )  Patient Location: PACU  Anesthesia Type:General  Level of Consciousness: awake, alert  and oriented  Airway & Oxygen Therapy: Patient Spontanous Breathing and Patient connected to face mask oxygen  Post-op Assessment: Report given to RN and Post -op Vital signs reviewed and stable  Post vital signs: Reviewed and stable  Last Vitals:  Vitals:   01/16/17 0707  BP: 118/75  Pulse: 94  Resp: 18  Temp: 36.7 C  SpO2: 100%    Last Pain:  Vitals:   01/16/17 0707  TempSrc: Oral         Complications: No apparent anesthesia complications

## 2017-01-16 NOTE — Anesthesia Preprocedure Evaluation (Signed)
Anesthesia Evaluation  Patient identified by MRN, date of birth, ID band Patient awake    Reviewed: Allergy & Precautions, NPO status , Patient's Chart, lab work & pertinent test results  History of Anesthesia Complications (+) PONV and history of anesthetic complications  Airway Mallampati: II  TM Distance: >3 FB Neck ROM: Full    Dental no notable dental hx. (+) Teeth Intact, Dental Advisory Given   Pulmonary neg pulmonary ROS,    Pulmonary exam normal breath sounds clear to auscultation       Cardiovascular negative cardio ROS Normal cardiovascular exam Rhythm:Regular Rate:Normal     Neuro/Psych  Headaches, negative neurological ROS  negative psych ROS   GI/Hepatic negative GI ROS, Neg liver ROS,   Endo/Other  negative endocrine ROS  Renal/GU negative Renal ROS     Musculoskeletal negative musculoskeletal ROS (+)   Abdominal   Peds  Hematology negative hematology ROS (+)   Anesthesia Other Findings   Reproductive/Obstetrics negative OB ROS                             Anesthesia Physical  Anesthesia Plan  ASA: II  Anesthesia Plan: General   Post-op Pain Management:    Induction: Intravenous  PONV Risk Score and Plan: 4 or greater and Ondansetron, Dexamethasone, Propofol, Midazolam and Scopolamine patch - Pre-op  Airway Management Planned: Oral ETT  Additional Equipment:   Intra-op Plan:   Post-operative Plan: Extubation in OR  Informed Consent: I have reviewed the patients History and Physical, chart, labs and discussed the procedure including the risks, benefits and alternatives for the proposed anesthesia with the patient or authorized representative who has indicated his/her understanding and acceptance.   Dental advisory given  Plan Discussed with: CRNA  Anesthesia Plan Comments:         Anesthesia Quick Evaluation

## 2017-01-16 NOTE — Anesthesia Procedure Notes (Signed)
Procedure Name: Intubation Date/Time: 01/16/2017 7:49 AM Performed by: Rayvon Char Pre-anesthesia Checklist: Patient identified, Emergency Drugs available, Suction available and Patient being monitored Patient Re-evaluated:Patient Re-evaluated prior to induction Oxygen Delivery Method: Circle system utilized, Simple face mask and Non-rebreather mask Preoxygenation: Pre-oxygenation with 100% oxygen Induction Type: IV induction Ventilation: Mask ventilation without difficulty Laryngoscope Size: Miller and 2 Tube type: Oral Number of attempts: 1

## 2017-01-19 ENCOUNTER — Encounter (HOSPITAL_BASED_OUTPATIENT_CLINIC_OR_DEPARTMENT_OTHER): Payer: Self-pay | Admitting: Plastic Surgery

## 2017-01-26 ENCOUNTER — Encounter: Payer: BC Managed Care – PPO | Admitting: Physical Therapy

## 2017-02-02 ENCOUNTER — Encounter: Payer: BC Managed Care – PPO | Admitting: Physical Therapy

## 2017-02-04 ENCOUNTER — Encounter: Payer: Self-pay | Admitting: Physical Therapy

## 2017-02-04 ENCOUNTER — Ambulatory Visit: Payer: BC Managed Care – PPO | Attending: Plastic Surgery | Admitting: Physical Therapy

## 2017-02-04 DIAGNOSIS — M25512 Pain in left shoulder: Secondary | ICD-10-CM | POA: Diagnosis present

## 2017-02-04 DIAGNOSIS — M25612 Stiffness of left shoulder, not elsewhere classified: Secondary | ICD-10-CM | POA: Insufficient documentation

## 2017-02-04 DIAGNOSIS — M25611 Stiffness of right shoulder, not elsewhere classified: Secondary | ICD-10-CM | POA: Diagnosis not present

## 2017-02-04 DIAGNOSIS — M25511 Pain in right shoulder: Secondary | ICD-10-CM | POA: Diagnosis present

## 2017-02-04 NOTE — Patient Instructions (Addendum)
Tie band in a loop.  Place inside loop against wall.  Pull elbow and loop back  With elbow bent.  Start with 5 times on each side    Continue walking.    Continue band exercises lying on back and with band around doorknob   When residual soreness decreases, begin Strength ABC program as previously instructed

## 2017-02-04 NOTE — Therapy (Signed)
[place], [place], [address] Phone: [phone]   Fax:  [phone]  Physical Therapy Treatment  Patient Details  Name: Anne Fuller MRN: [id number] Date of Birth: May 05, 1975 Referring Provider: Dr. Irene Limbo   Encounter Date: 02/04/2017  PT End of Session - 02/04/17 1230    Visit Number  14    Number of Visits  15    Date for PT Re-Evaluation  01/19/17    PT Start Time  0850    PT Stop Time  0930    PT Time Calculation (min)  40 min    Activity Tolerance  Patient tolerated treatment well    Behavior During Therapy  [facility] for tasks assessed/performed       Past Medical History:  Diagnosis Date  . 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 Complication of anesthesia    difficult to wake up  . High cholesterol   . PONV (postoperative nausea and vomiting)     Past Surgical History:  Procedure Laterality Date  . MASTECTOMY Bilateral    with reconstruction  . WISDOM TOOTH EXTRACTION      There were no vitals filed for this visit.  Subjective Assessment - 02/04/17 0959    Subjective  Pt is doing well after surgery She still has a little surgery at right posterior shoulder. She is beginning to return to activites    Pertinent History  BRCA 1 positive with bilateral prophylactic mastectomy on 10/07/16 with no lymph nodes removed.  High cholesterol.  Has had PT in the past and chiropractic for pinched nerve in back; h/o hip bursitis and gets that pain shwen she walks a lot but that doesn't bother her when she weights less.    Patient Stated Goals  try to get more flexibility and ROM with hers arms         [facility] PT Assessment - 02/04/17 0001      Observation/Other Assessments   Quick DASH   0      AROM   Right Shoulder Flexion  170 Degrees    Right Shoulder ABduction  165 Degrees    Left Shoulder Flexion  180 Degrees    Left Shoulder ABduction  175 Degrees           Quick Dash -  02/04/17 0001    Open a tight or new jar  No difficulty    Do heavy household chores (wash walls, wash floors)  No difficulty    Carry a shopping bag or briefcase  No difficulty    Wash your back  No difficulty    Use a knife to cut food  No difficulty    Recreational activities in which you take some force or impact through your arm, shoulder, or hand (golf, hammering, tennis)  No difficulty    During the past week, to what extent has your arm, shoulder or hand problem interfered with your normal social activities with family, friends, neighbors, or groups?  Not at all    During the past week, to what extent has your arm, shoulder or hand problem limited your work or other regular daily activities  Not at all    Arm, shoulder, or hand pain.  None    Tingling (pins and needles) in your arm, shoulder, or hand  None    Difficulty Sleeping  No difficulty    DASH Score  0 %            [facility]  Adult PT Treatment/Exercise - 02/04/17 0001      Shoulder Exercises: Standing   External Rotation  Strengthening;Both;10 reps;Theraband    Theraband Level (Shoulder External Rotation)  Level 1 (Yellow);Level 2 (Red)    Internal Rotation  Strengthening;Both;10 reps;Theraband    Theraband Level (Shoulder Internal Rotation)  Level 2 (Red)    Flexion  Strengthening;Both;10 reps;Theraband    Theraband Level (Shoulder Flexion)  Level 2 (Red)    Extension  Strengthening;Both;10 reps;Theraband    Theraband Level (Shoulder Extension)  Level 2 (Red)    Other Standing Exercises  attempted scapular series yellow theraband in standing, but pt had incoordination of muscles of right arm. , recommended she continue doing those in supine with yellow band until that improves.     Other Standing Exercises  yellow theraband loop with elbow retraction x 5 reps                      Long Term Clinic Goals - 02/04/17 1239      CC Long Term Goal  #1   Title  Pt. will show at least 150 degrees of active  shoulder flexion bilaterally for improved overhead reach    Status  Achieved      CC Long Term Goal  #2   Title  Pt. will show at least 160 degrees of active shoulder abduction bilaterally for improved ADLs    Status  Achieved      CC Long Term Goal  #3   Title  Pt. will show at least 85 degrees shoulder er bilaterally for improved ADLs    Status  Achieved      CC Long Term Goal  #4   Title  Independent in HEP for shoulder ROM and strengthening    Status  Achieved      CC Long Term Goal  #5   Title  Will report at least 50% decrease in discomfort with reaching with each arm.    Status  Achieved      CC Long Term Goal  #6   Title  Pt will report no pain in R shoulder when flexing R arm at end range to allow improved comfort.    Status  Partially Met         Plan - 02/04/17 1233    Clinical Impression Statement  Pt has been doing very well after her implant surgery and has returned to more household activity.  She still has some residual soreness at lateral chest,  She has pain in right shoulder with limits of shoulder flexion with mild decreased scapular stablaization. All exercises were reviewed today and expect she will improve in this as she gets stronger.  She is ready to discharge from PT but knows she can call us at any time with questions.     Rehab Potential  Excellent    Clinical Impairments Affecting Rehab Potential  none    PT Next Visit Plan  discharge this episode     Consulted and Agree with Plan of Care  Patient       Patient will benefit from skilled therapeutic intervention in order to improve the following deficits and impairments:  Decreased range of motion, Impaired UE functional use, Pain, Decreased scar mobility, Increased edema  Visit Diagnosis: Stiffness of right shoulder, not elsewhere classified  Right shoulder pain, unspecified chronicity  Stiffness of left shoulder, not elsewhere classified  Left shoulder pain, unspecified  chronicity     Problem List Patient  Active Problem List   Diagnosis Date Noted  . BRCA gene mutation positive 10/07/2016  . Headache(784.0) 03/09/2013  . 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  Patient goals were met. Patient is being discharged due to being pleased with the current functional level.  ?????     Donato Heinz. Owens Shark PT   Norwood Levo 02/04/2017, 12:40 PM  [place], [place], [address] Phone: [phone]   Fax:  [phone]  Name: Anne Fuller MRN: [id number] Date of Birth: 1975/07/12

## 2017-05-31 IMAGING — MR MR BILATERAL BREAST WITHOUT AND WITH CONTRAST
8 of 12 series · 33 of 48 positions shown · IV contrast (multihance)
Comparison: Previous exams including breast MRIs, most recent prior
breast MRI dated 11/23/2013.

CLINICAL DATA: High risk screening. Patient is BRCA 1 positive and
has a strong family history of breast carcinoma including 2 first
[place]. Patient considering prophylactic bilateral mastectomy.

LABS:  No labs drawn at the time of imaging.
EXAM:
BILATERAL BREAST MRI WITH AND WITHOUT CONTRAST
TECHNIQUE: Multiplanar, multisequence MR images of both breasts were obtained
prior to and following the intravenous administration of 18 ml of
MultiHance.

[Series 2: t2_tirm_tra ipat (a-p) · axial · 3.0mm · 0.70mm/px · 1 of 55 slices shown]
[im 1/55]
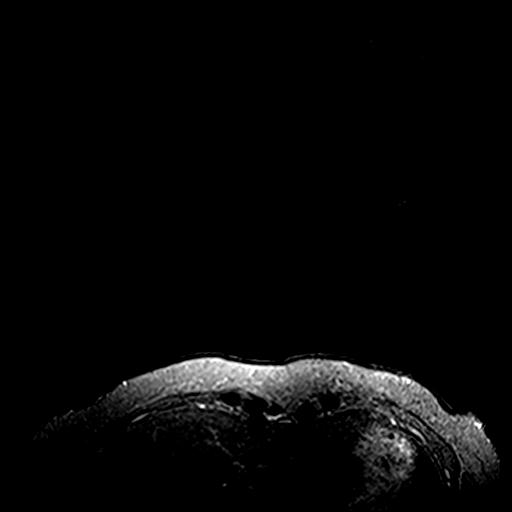

[Series 3: fl3d pre-cm no · axial · non-contrast · 1.2mm · 0.94mm/px · z∈[-87,+84]mm · 5 of 144 slices shown]
[im 1/144]
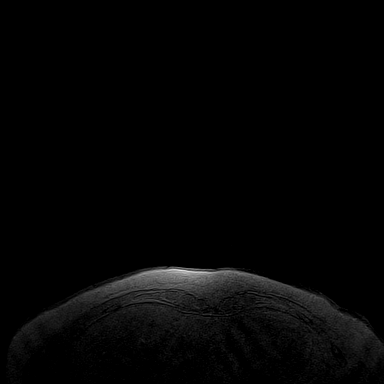
[im 36/144]
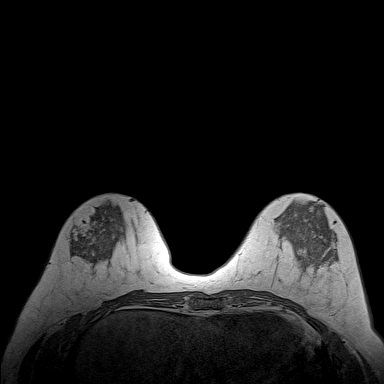
[im 72/144]
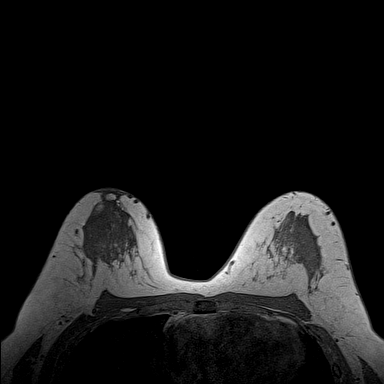
[im 108/144]
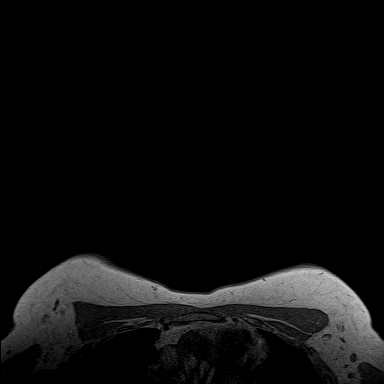
[im 144/144]
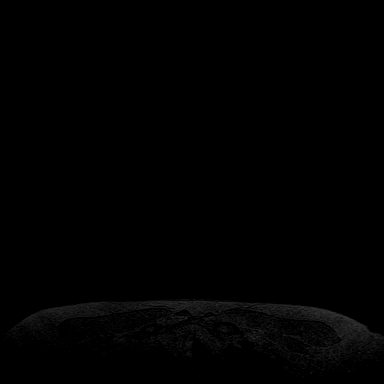

[Series 4: fl3d pre-cm · axial · non-contrast · 1.2mm · 0.94mm/px · z∈[-87,+84]mm · 5 of 144 slices shown]
[im 1/144]
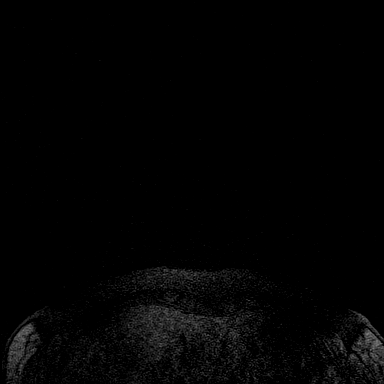
[im 36/144]
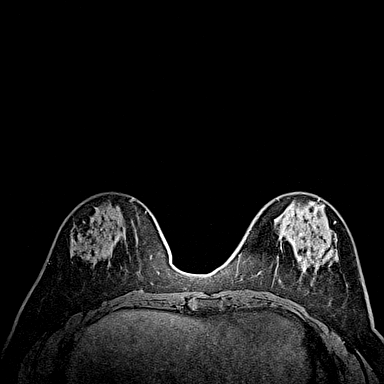
[im 72/144]
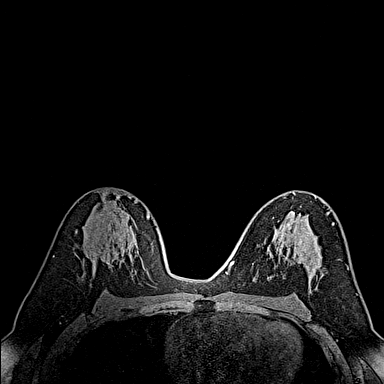
[im 108/144]
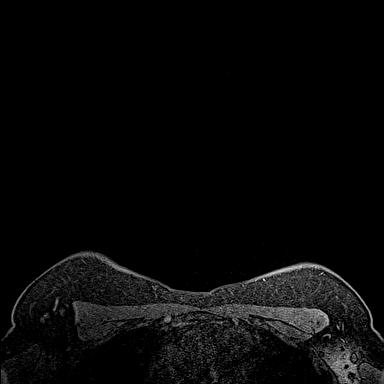
[im 144/144]
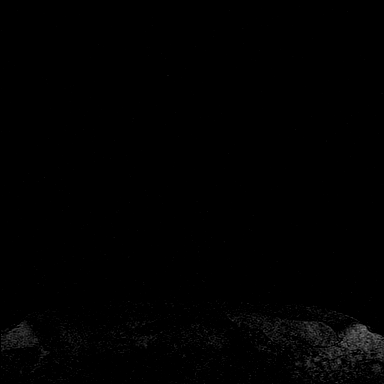

[Series 5: fl3d post-cm 20 · axial · 1.2mm · 0.94mm/px · z∈[-87,+84]mm · 5 of 144 slices shown (1 of 3)]
[im 1/144]
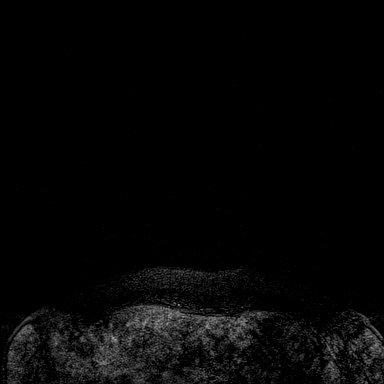
[im 36/144]
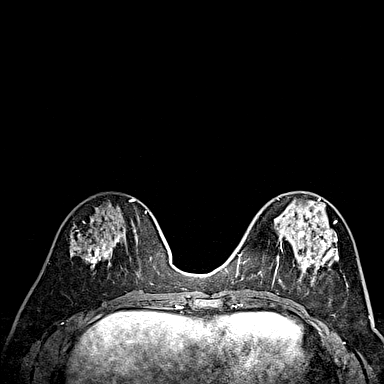
[im 72/144]
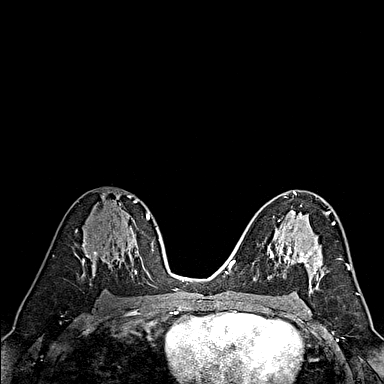
[im 108/144]
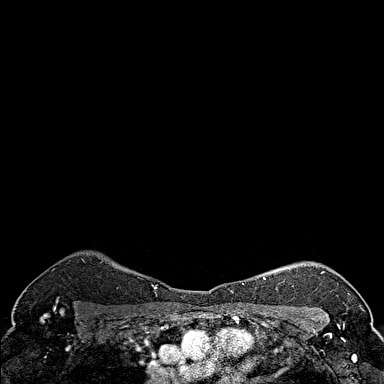
[im 144/144]
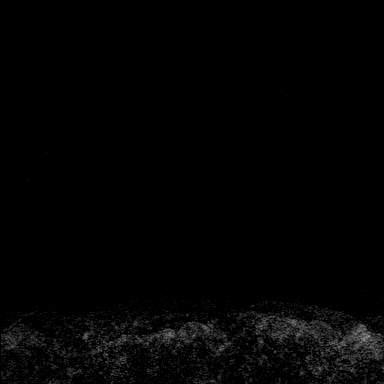

[Series 6: fl3d post-cm 20 · axial · 1.2mm · 0.94mm/px · z∈[-87,+84]mm · 5 of 144 slices shown (2 of 3)]
[im 1/144]
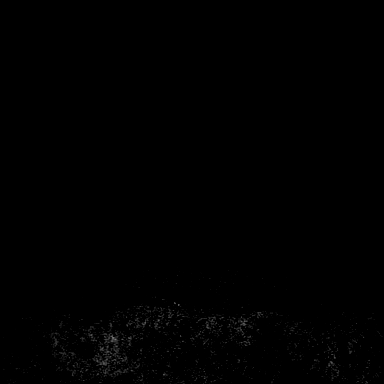
[im 36/144]
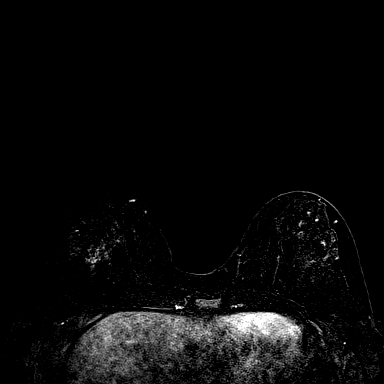
[im 72/144]
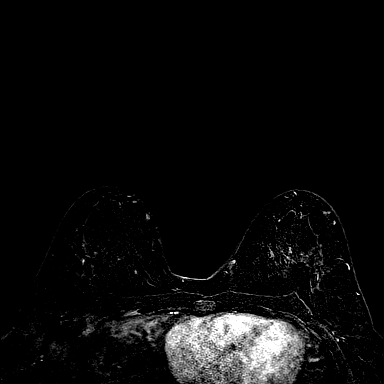
[im 108/144]
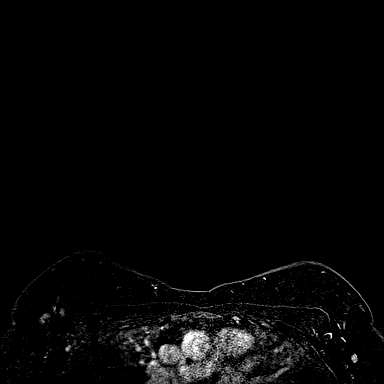
[im 144/144]
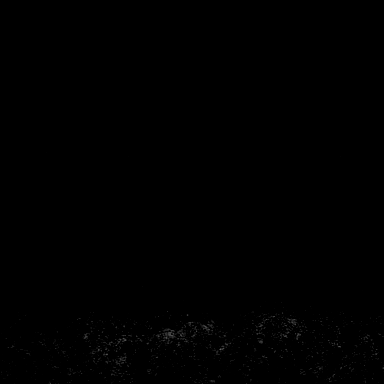

[Series 7: fl3d post-cm 20 · axial · 172.8mm · 0.94mm/px · 1 of 1 slices shown (3 of 3)]
[im 1/1]
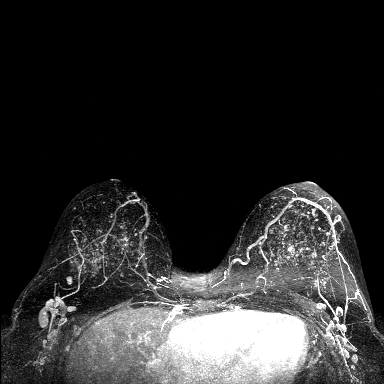

[Series 8: fl3d post-cm 3min · axial · 1.2mm · 0.94mm/px · z∈[-87,+84]mm · 6 of 144 slices shown]
[im 1/144]
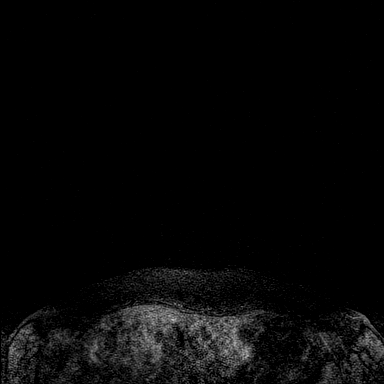
[im 29/144]
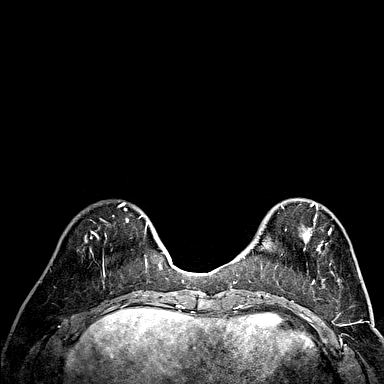
[im 58/144]
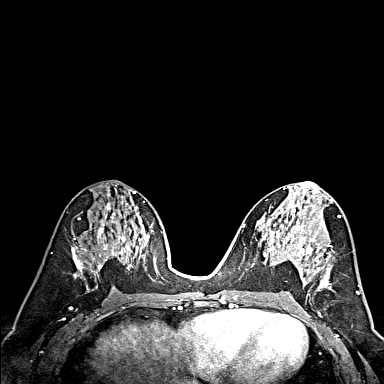
[im 86/144]
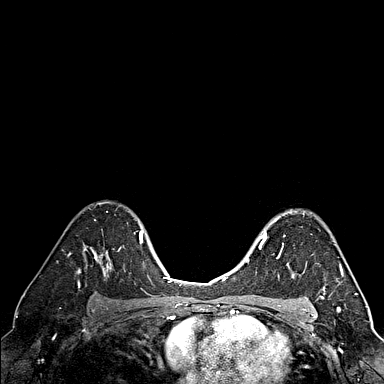
[im 115/144]
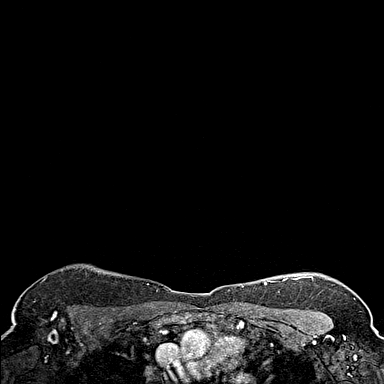
[im 144/144]
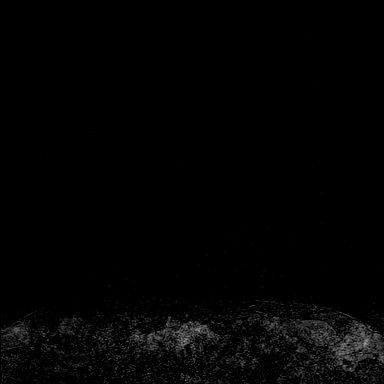

[Series 9: fl3d post-cm 3min_sub · axial · 1.2mm · 0.94mm/px · z∈[-87,+50]mm · 5 of 144 slices shown]
[im 1/144]
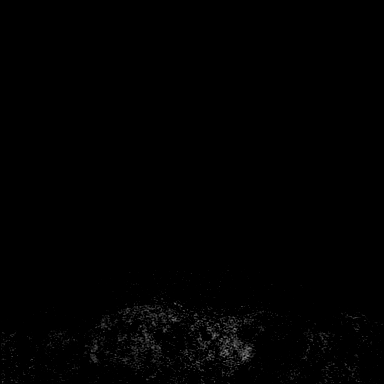
[im 29/144]
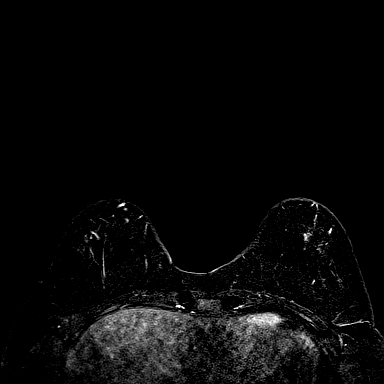
[im 58/144]
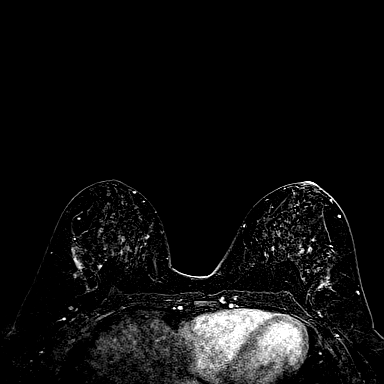
[im 86/144]
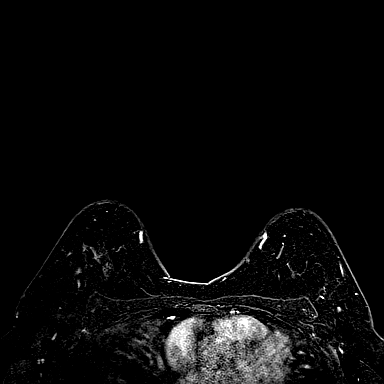
[im 115/144]
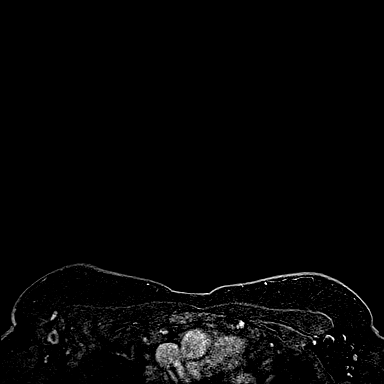

[33 of 48 positions shown; findings below may reference images not displayed]

THREE-DIMENSIONAL MR IMAGE RENDERING ON INDEPENDENT WORKSTATION:

Three-dimensional MR images were rendered by post-processing of the
original MR data on an independent workstation. The
three-dimensional MR images were interpreted, and findings are
reported in the following complete MRI report for this study. Three
dimensional images were evaluated at the independent [employer]
workstation
FINDINGS: Breast composition: c. Heterogeneous fibroglandular tissue.

Background parenchymal enhancement: Moderate.

Right breast: No mass or abnormal enhancement.

Left breast: No mass or abnormal enhancement.

Lymph nodes: No abnormal appearing lymph nodes.

Ancillary findings:  None.
IMPRESSION: 1. There is no abnormal enhancement in either breast to suggest
breast malignancy.
2. No change from the prior breast MRI.

RECOMMENDATION:
1. Screening mammogram in one year.(Code:[id number]-[id number])
2. Annual or biannual screening breast MRI recommended as a
supplement to screening mammography given the patient's
substantially elevated risk of developing breast carcinoma and heard
mammographically dense breast architecture.

BI-RADS CATEGORY  1: Negative.
